# Patient Record
Sex: Female | Born: 1970
Health system: Southern US, Community
[De-identification: ages and names within clinical notes are randomized; demographics above are authoritative.]

## PROBLEM LIST (undated history)

## (undated) DIAGNOSIS — F329 Major depressive disorder, single episode, unspecified: Secondary | ICD-10-CM

## (undated) DIAGNOSIS — I10 Essential (primary) hypertension: Secondary | ICD-10-CM

## (undated) DIAGNOSIS — F32A Depression, unspecified: Secondary | ICD-10-CM

## (undated) HISTORY — DX: Major depressive disorder, single episode, unspecified: F32.9

## (undated) HISTORY — DX: Depression, unspecified: F32.A

---

## 2011-03-07 ENCOUNTER — Emergency Department (HOSPITAL_COMMUNITY)
Admission: EM | Admit: 2011-03-07 | Discharge: 2011-03-07 | Discharge status: Against Medical Advice | Payer: Self-pay | Attending: Emergency Medicine | Admitting: Emergency Medicine

## 2011-03-07 ENCOUNTER — Other Ambulatory Visit: Payer: Self-pay

## 2011-03-07 ENCOUNTER — Encounter: Payer: Self-pay | Admitting: Emergency Medicine

## 2011-03-07 ENCOUNTER — Emergency Department (INDEPENDENT_AMBULATORY_CARE_PROVIDER_SITE_OTHER): Payer: Self-pay

## 2011-03-07 ENCOUNTER — Emergency Department (HOSPITAL_BASED_OUTPATIENT_CLINIC_OR_DEPARTMENT_OTHER)
Admission: EM | Admit: 2011-03-07 | Discharge: 2011-03-07 | Disposition: A | Discharge status: Home / Self Care | Payer: Self-pay | Attending: Emergency Medicine | Admitting: Emergency Medicine

## 2011-03-07 ENCOUNTER — Encounter (HOSPITAL_BASED_OUTPATIENT_CLINIC_OR_DEPARTMENT_OTHER): Payer: Self-pay

## 2011-03-07 DIAGNOSIS — R42 Dizziness and giddiness: Secondary | ICD-10-CM | POA: Insufficient documentation

## 2011-03-07 DIAGNOSIS — F419 Anxiety disorder, unspecified: Secondary | ICD-10-CM

## 2011-03-07 DIAGNOSIS — R079 Chest pain, unspecified: Secondary | ICD-10-CM

## 2011-03-07 DIAGNOSIS — R209 Unspecified disturbances of skin sensation: Secondary | ICD-10-CM

## 2011-03-07 DIAGNOSIS — F411 Generalized anxiety disorder: Secondary | ICD-10-CM | POA: Insufficient documentation

## 2011-03-07 DIAGNOSIS — M79609 Pain in unspecified limb: Secondary | ICD-10-CM | POA: Insufficient documentation

## 2011-03-07 DIAGNOSIS — M542 Cervicalgia: Secondary | ICD-10-CM | POA: Insufficient documentation

## 2011-03-07 LAB — CBC
Hemoglobin: 12.2 g/dL (ref 12.0–15.0)
MCH: 22.3 pg — ABNORMAL LOW (ref 26.0–34.0)
RBC: 5.48 MIL/uL — ABNORMAL HIGH (ref 3.87–5.11)
WBC: 4.8 10*3/uL (ref 4.0–10.5)

## 2011-03-07 LAB — CARDIAC PANEL(CRET KIN+CKTOT+MB+TROPI)
CK, MB: 1.8 ng/mL (ref 0.3–4.0)
Relative Index: 0.5 (ref 0.0–2.5)
Troponin I: <0.3 ng/mL (ref <0.30)

## 2011-03-07 LAB — BASIC METABOLIC PANEL
CO2: 25 mEq/L (ref 19–32)
Chloride: 103 mEq/L (ref 96–112)
Glucose, Bld: 108 mg/dL — ABNORMAL HIGH (ref 70–99)
Potassium: 4.9 mEq/L (ref 3.5–5.1)
Sodium: 136 mEq/L (ref 135–145)

## 2011-03-07 MED ORDER — MORPHINE SULFATE 4 MG/ML IJ SOLN
4.0000 mg | Freq: Once | INTRAMUSCULAR | Status: DC
  Filled 2011-03-07: qty 1

## 2011-03-07 MED ORDER — ALPRAZOLAM 1 MG PO TABS: 1.0000 mg | ORAL_TABLET | Freq: Three times a day (TID) | ORAL | Status: AC | PRN

## 2011-03-07 MED ORDER — ONDANSETRON HCL 4 MG/2ML IJ SOLN
4.0000 mg | Freq: Once | INTRAMUSCULAR | Status: DC
Start: 2011-03-07 — End: 2011-03-07
  Filled 2011-03-07: qty 2

## 2011-03-07 NOTE — ED Provider Notes (Signed)
History/physical exam/procedure(s) were performed by non-physician practitioner and as supervising physician I was immediately available for consultation/collaboration. I have reviewed all notes and am in agreement with care and plan.   Hilario Quarry, MD 03/07/11 2229

## 2011-03-07 NOTE — ED Provider Notes (Signed)
History     CSN: [id number] Arrival date & time: 03/07/2011  4:55 PM   First MD Initiated Contact with Patient 03/07/11 1721      Chief Complaint  Patient presents with  . Chest Pain  . Dizziness  . Numbness    (Consider location/radiation/quality/duration/timing/severity/associated sxs/prior treatment) HPI Comments: Pt states that she was at work learning a new job when she had some tingling in her left arm and radiated down from her neck:pt states that she felt near syncopal dizzy so she went to the hospital and waited and the symptoms seems to worsen so she decided to drive over here and when she was driving over here she developed cp  Patient is a 40 y.o. female presenting with chest pain. The history is provided by the patient. No language interpreter was used.  Chest Pain The chest pain began 3 - 5 hours ago. Chest pain occurs intermittently. The chest pain is improving. The pain is associated with stress. The severity of the pain is moderate. The quality of the pain is described as aching. The pain does not radiate. Pertinent negatives for primary symptoms include no fever, no cough, no wheezing, no nausea, no vomiting and no dizziness.  Pertinent negatives for associated symptoms include no numbness. She tried nothing for the symptoms. Risk factors include stress.  Her past medical history is significant for hypertension.     History reviewed. No pertinent past medical history.  History reviewed. No pertinent past surgical history.  No family history on file.  History  Substance Use Topics  . Smoking status: Never Smoker   . Smokeless tobacco: Never Used  . Alcohol Use: Yes     Only ocassionally    OB History    Grav Para Term Preterm Abortions TAB SAB Ect Mult Living                  Review of Systems  Constitutional: Negative for fever.  Respiratory: Negative for cough and wheezing.   Cardiovascular: Positive for chest pain.  Gastrointestinal: Negative for  nausea and vomiting.  Neurological: Negative for dizziness and numbness.  All other systems reviewed and are negative.    Allergies  Review of patient's allergies indicates no known allergies.  Home Medications   Current Outpatient Rx  Name Route Sig Dispense Refill  . DIPHENHYDRAMINE HCL 25 MG PO TABS Oral Take 50 mg by mouth at bedtime. For allergy     . LYSINE 500 MG PO CAPS Oral Take 2 capsules by mouth daily.      Carma Leaven M PLUS PO TABS Oral Take 1 tablet by mouth every evening.      Marland Kitchen NAPHAZOLINE HCL 0.012 % OP SOLN Both Eyes Place 2 drops into both eyes 2 (two) times daily as needed. For red dry eyes     . LYSINE PO Oral Take 1 capsule by mouth daily.        BP 173/107  Pulse 100  Temp(Src) 98.3 F (36.8 C) (Oral)  Resp 18  Ht 5\' 4"  (1.626 m)  Wt 175 lb (79.379 kg)  BMI 30.04 kg/m2  SpO2 100%  LMP 02/28/2011  Physical Exam  Nursing note and vitals reviewed. Constitutional: She is oriented to person, place, and time. She appears well-developed and well-nourished.  HENT:  Head: Normocephalic and atraumatic.  Eyes: Conjunctivae and EOM are normal. Pupils are equal, round, and reactive to light.  Neck: Normal range of motion.  Cardiovascular: Normal rate and regular rhythm.  Pulmonary/Chest: Effort normal and breath sounds normal. She exhibits tenderness.  Abdominal: Soft. Bowel sounds are normal.  Musculoskeletal: Normal range of motion.  Neurological: She is alert and oriented to person, place, and time.  Skin: Skin is warm and dry.  Psychiatric: Her mood appears anxious.    ED Course  Procedures (including critical care time)  Labs Reviewed  CBC - Abnormal; Notable for the following:    RBC 5.48 (*)    MCV 66.8 (*)    MCH 22.3 (*)    RDW 17.0 (*)    All other components within normal limits  BASIC METABOLIC PANEL - Abnormal; Notable for the following:    Glucose, Bld 108 (*)    All other components within normal limits  CARDIAC PANEL(CRET  KIN+CKTOT+MB+TROPI) - Abnormal; Notable for the following:    Total CK 352 (*)    All other components within normal limits   Dg Chest 2 View  03/07/2011  *RADIOLOGY REPORT*  Clinical Data: Dizziness.  Left arm numbness  CHEST - 2 VIEW  Comparison: None.  Findings: Mild convex right thoracolumbar spine curvature. Midline trachea.  Normal heart size and mediastinal contours. No pleural effusion or pneumothorax.  Clear lungs.  IMPRESSION: No acute cardiopulmonary disease.  Original Report Authenticated By: Consuello Bossier, M.D.    Date: 03/07/2011  Rate: 83  Rhythm: normal sinus rhythm  QRS Axis: normal  Intervals: normal  ST/T Wave abnormalities: normal  Conduction Disutrbances:none  Narrative Interpretation:   Old EKG Reviewed: none available   1. Chest pain   2. 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

## 2011-03-07 NOTE — ED Notes (Signed)
Pt reports she was driving when she developed chest pain.  She also reports left arm numbness and tingling radiating to shoulder and neck.

## 2011-03-07 NOTE — ED Notes (Signed)
Pt st's she was at work and became dizzy after eating lunch, then developed pain in back of neck and left arm.  Pt denies any chest pain at this time

## 2011-08-04 ENCOUNTER — Other Ambulatory Visit (HOSPITAL_COMMUNITY)
Admission: RE | Admit: 2011-08-04 | Discharge: 2011-08-04 | Disposition: A | Discharge status: Home / Self Care | Payer: 59 | Source: Ambulatory Visit | Attending: Obstetrics & Gynecology | Admitting: Obstetrics & Gynecology

## 2011-08-04 DIAGNOSIS — Z1159 Encounter for screening for other viral diseases: Secondary | ICD-10-CM | POA: Insufficient documentation

## 2011-08-04 DIAGNOSIS — Z113 Encounter for screening for infections with a predominantly sexual mode of transmission: Secondary | ICD-10-CM | POA: Insufficient documentation

## 2011-08-04 DIAGNOSIS — Z124 Encounter for screening for malignant neoplasm of cervix: Secondary | ICD-10-CM | POA: Insufficient documentation

## 2011-08-22 ENCOUNTER — Encounter: Payer: Self-pay | Admitting: Gastroenterology

## 2011-09-12 ENCOUNTER — Ambulatory Visit: Payer: 59 | Admitting: Gastroenterology

## 2011-10-09 ENCOUNTER — Encounter: Payer: Self-pay | Admitting: Gastroenterology

## 2011-10-09 ENCOUNTER — Ambulatory Visit (INDEPENDENT_AMBULATORY_CARE_PROVIDER_SITE_OTHER): Payer: 59 | Admitting: Gastroenterology

## 2011-10-09 ENCOUNTER — Ambulatory Visit: Payer: 59 | Admitting: Gastroenterology

## 2011-10-09 VITALS — BP 134/78 | HR 88 | Ht 64.0 in | Wt 184.0 lb

## 2011-10-09 DIAGNOSIS — R195 Other fecal abnormalities: Secondary | ICD-10-CM

## 2011-10-09 MED ORDER — MOVIPREP 100 G PO SOLR: 1.0000 | Freq: Once | ORAL | Status: DC

## 2011-10-09 NOTE — Patient Instructions (Addendum)
You have been scheduled for a colonoscopy with propofol. Please follow written instructions given to you at your visit today.  Please pick up your prep kit at the pharmacy within the next 1-3 days. cc: Filbert Berthold, MD

## 2011-10-09 NOTE — Progress Notes (Signed)
History of Present Illness: This is a 41 year old female was recently found to have iFOB positive stool on screening examination. She has no gastrointestinal complaints and there is no family history of colon cancer or colon polyps. Blood work revealed microcytosis without anemia. Denies weight loss, abdominal pain, constipation, diarrhea, change in stool caliber, melena, hematochezia, nausea, vomiting, dysphagia, reflux symptoms, chest pain.  No Known Allergies Outpatient Prescriptions Prior to Visit  Medication Sig Dispense Refill  . diphenhydrAMINE (BENADRYL) 25 MG tablet Take 50 mg by mouth at bedtime. For allergy       . Multiple Vitamins-Minerals (MULTIVITAMINS THER. W/MINERALS) TABS Take 1 tablet by mouth every evening.        . naphazoline (CLEAR EYES) 0.012 % ophthalmic solution Place 2 drops into both eyes 2 (two) times daily as needed. For red dry eyes       . Lysine 500 MG CAPS Take 2 capsules by mouth daily.       Marland Kitchen LYSINE PO Take 1 capsule by mouth daily.         Past Medical History  Diagnosis Date  . Depression    No past surgical history on file. History   Social History  . Marital Status: Divorced    Spouse Name: N/A    Number of Children: N/A  . Years of Education: N/A   Occupational History  . Customer Care Professional  [employer]   Social History Main Topics  . Smoking status: Never Smoker   . Smokeless tobacco: Never Used  . Alcohol Use: 1.2 oz/week    2 Glasses of wine per week  . Drug Use: No  . Sexually Active: Not Currently   Other Topics Concern  . None   Social History Narrative   Daily caffeine    Family History  Problem Relation Age of Onset  . Ovarian cancer Mother   . Diabetes Maternal Grandmother   . Hypertension      Grandparents  . Colon cancer Neg Hx     Review of Systems: Pertinent positive and negative review of systems were noted in the above HPI section. All other review of systems were otherwise negative.  Physical  Exam: General: Well developed , well nourished, no acute distress Head: Normocephalic and atraumatic Eyes:  sclerae anicteric, EOMI Ears: Normal auditory acuity Mouth: No deformity or lesions Neck: Supple, no masses or thyromegaly Lungs: Clear throughout to auscultation Heart: Regular rate and rhythm; no murmurs, rubs or bruits Abdomen: Soft, non tender and non distended. No masses, hepatosplenomegaly or hernias noted. Normal Bowel sounds Rectal: Deferred to colonoscopy Musculoskeletal: Symmetrical with no gross deformities  Skin: No lesions on visible extremities Pulses:  Normal pulses noted Extremities: No clubbing, cyanosis, edema or deformities noted Neurological: Alert oriented x 4, grossly nonfocal Cervical Nodes:  No significant cervical adenopathy Inguinal Nodes: No significant inguinal adenopathy Psychological:  Alert and cooperative. Normal mood and affect  Assessment and Recommendations:  1. Heme positive stool. Rule out colorectal neoplasms. Schedule colonoscopy. The risks, benefits, and alternatives to colonoscopy with possible biopsy and possible polypectomy were discussed with the patient and they consent to proceed.

## 2011-11-01 ENCOUNTER — Telehealth: Payer: Self-pay | Admitting: Gastroenterology

## 2011-11-01 NOTE — Telephone Encounter (Signed)
I have mailed her a rebate coupon for Moviprep

## 2011-11-01 NOTE — Telephone Encounter (Signed)
Patient is scheduled for a colonoscopy next week with Dr. Russella Dar (11/09/11).  She says that the prep is "like" 70.00 and wants to know if there is anything cheaper.  Please advise.

## 2011-11-09 ENCOUNTER — Encounter: Payer: 59 | Admitting: Gastroenterology

## 2014-09-16 ENCOUNTER — Other Ambulatory Visit: Payer: Self-pay | Admitting: Obstetrics & Gynecology

## 2014-09-16 ENCOUNTER — Other Ambulatory Visit (HOSPITAL_COMMUNITY)
Admission: RE | Admit: 2014-09-16 | Discharge: 2014-09-16 | Disposition: A | Discharge status: Home / Self Care | Payer: BLUE CROSS/BLUE SHIELD | Source: Ambulatory Visit | Attending: Obstetrics & Gynecology | Admitting: Obstetrics & Gynecology

## 2014-09-16 DIAGNOSIS — Z1151 Encounter for screening for human papillomavirus (HPV): Secondary | ICD-10-CM | POA: Insufficient documentation

## 2014-09-16 DIAGNOSIS — Z01419 Encounter for gynecological examination (general) (routine) without abnormal findings: Secondary | ICD-10-CM | POA: Diagnosis not present

## 2014-09-22 LAB — CYTOLOGY - PAP

## 2015-02-02 ENCOUNTER — Emergency Department (HOSPITAL_BASED_OUTPATIENT_CLINIC_OR_DEPARTMENT_OTHER): Payer: BLUE CROSS/BLUE SHIELD

## 2015-02-02 ENCOUNTER — Emergency Department (HOSPITAL_BASED_OUTPATIENT_CLINIC_OR_DEPARTMENT_OTHER)
Admission: EM | Admit: 2015-02-02 | Discharge: 2015-02-02 | Disposition: A | Discharge status: Home / Self Care | Payer: BLUE CROSS/BLUE SHIELD | Attending: Emergency Medicine | Admitting: Emergency Medicine

## 2015-02-02 ENCOUNTER — Encounter (HOSPITAL_BASED_OUTPATIENT_CLINIC_OR_DEPARTMENT_OTHER): Payer: Self-pay | Admitting: *Deleted

## 2015-02-02 DIAGNOSIS — R1032 Left lower quadrant pain: Secondary | ICD-10-CM | POA: Diagnosis not present

## 2015-02-02 DIAGNOSIS — R1030 Lower abdominal pain, unspecified: Secondary | ICD-10-CM | POA: Diagnosis present

## 2015-02-02 DIAGNOSIS — R102 Pelvic and perineal pain: Secondary | ICD-10-CM | POA: Insufficient documentation

## 2015-02-02 DIAGNOSIS — R42 Dizziness and giddiness: Secondary | ICD-10-CM | POA: Insufficient documentation

## 2015-02-02 DIAGNOSIS — R300 Dysuria: Secondary | ICD-10-CM | POA: Diagnosis not present

## 2015-02-02 DIAGNOSIS — I10 Essential (primary) hypertension: Secondary | ICD-10-CM | POA: Insufficient documentation

## 2015-02-02 DIAGNOSIS — R109 Unspecified abdominal pain: Secondary | ICD-10-CM

## 2015-02-02 DIAGNOSIS — Z3202 Encounter for pregnancy test, result negative: Secondary | ICD-10-CM | POA: Insufficient documentation

## 2015-02-02 DIAGNOSIS — Z8659 Personal history of other mental and behavioral disorders: Secondary | ICD-10-CM | POA: Diagnosis not present

## 2015-02-02 DIAGNOSIS — R319 Hematuria, unspecified: Secondary | ICD-10-CM | POA: Insufficient documentation

## 2015-02-02 HISTORY — DX: Essential (primary) hypertension: I10

## 2015-02-02 LAB — CBC WITH DIFFERENTIAL/PLATELET
Basophils Absolute: 0 10*3/uL (ref 0.0–0.1)
Basophils Relative: 0 %
EOS ABS: 0.2 10*3/uL (ref 0.0–0.7)
Eosinophils Relative: 3 %
HEMATOCRIT: 34.8 % — AB (ref 36.0–46.0)
HEMOGLOBIN: 11.3 g/dL — AB (ref 12.0–15.0)
LYMPHS PCT: 39 %
Lymphs Abs: 2.3 10*3/uL (ref 0.7–4.0)
MCH: 21.9 pg — ABNORMAL LOW (ref 26.0–34.0)
MCHC: 32.5 g/dL (ref 30.0–36.0)
MCV: 67.3 fL — ABNORMAL LOW (ref 78.0–100.0)
MONO ABS: 0.5 10*3/uL (ref 0.1–1.0)
Monocytes Relative: 8 %
NEUTROS PCT: 50 %
Neutro Abs: 2.8 10*3/uL (ref 1.7–7.7)
Platelets: 257 10*3/uL (ref 150–400)
RBC: 5.17 MIL/uL — AB (ref 3.87–5.11)
RDW: 15.7 % — ABNORMAL HIGH (ref 11.5–15.5)
WBC: 5.8 10*3/uL (ref 4.0–10.5)

## 2015-02-02 LAB — URINALYSIS, ROUTINE W REFLEX MICROSCOPIC
BILIRUBIN URINE: NEGATIVE
Glucose, UA: NEGATIVE mg/dL
KETONES UR: NEGATIVE mg/dL
NITRITE: NEGATIVE
PH: 6 (ref 5.0–8.0)
Protein, ur: NEGATIVE mg/dL
SPECIFIC GRAVITY, URINE: 1.016 (ref 1.005–1.030)
UROBILINOGEN UA: 0.2 mg/dL (ref 0.0–1.0)

## 2015-02-02 LAB — PREGNANCY, URINE: Preg Test, Ur: NEGATIVE

## 2015-02-02 LAB — COMPREHENSIVE METABOLIC PANEL
ALBUMIN: 3.9 g/dL (ref 3.5–5.0)
ALK PHOS: 59 U/L (ref 38–126)
ALT: 17 U/L (ref 14–54)
ANION GAP: 3 — AB (ref 5–15)
AST: 16 U/L (ref 15–41)
BILIRUBIN TOTAL: 0.4 mg/dL (ref 0.3–1.2)
BUN: 10 mg/dL (ref 6–20)
CALCIUM: 8.8 mg/dL — AB (ref 8.9–10.3)
CO2: 24 mmol/L (ref 22–32)
CREATININE: 0.59 mg/dL (ref 0.44–1.00)
Chloride: 108 mmol/L (ref 101–111)
GFR calc Af Amer: >60 mL/min (ref >60)
GFR calc non Af Amer: >60 mL/min (ref >60)
GLUCOSE: 111 mg/dL — AB (ref 65–99)
Potassium: 3.9 mmol/L (ref 3.5–5.1)
SODIUM: 135 mmol/L (ref 135–145)
Total Protein: 7.2 g/dL (ref 6.5–8.1)

## 2015-02-02 LAB — URINE MICROSCOPIC-ADD ON

## 2015-02-02 MED ORDER — NITROFURANTOIN MONOHYD MACRO 100 MG PO CAPS: 100.0000 mg | ORAL_CAPSULE | Freq: Two times a day (BID) | ORAL | Status: DC

## 2015-02-02 MED ORDER — PHENAZOPYRIDINE HCL 200 MG PO TABS: 200.0000 mg | ORAL_TABLET | Freq: Three times a day (TID) | ORAL | Status: DC

## 2015-02-02 NOTE — ED Notes (Addendum)
Lower abdominal pain started at work today. Hematuria.

## 2015-02-02 NOTE — ED Provider Notes (Signed)
CSN: [id number]     Arrival date & time 02/02/15  2031 History  By signing my name below, I, Meriel Pica, attest that this documentation has been prepared under the direction and in the presence of Shawn Joy, PA-C. Electronically Signed: Meriel Pica, ED Scribe. 02/02/2015. 9:01 PM.   Chief Complaint  Patient presents with  . Abdominal Pain   The history is provided by the patient. No language interpreter was used.   HPI Comments: Casey Palmer is a 44 y.o. female, with a PMhx of HTN and depression, who presents to the Emergency Department complaining of sudden onset, intermittent, sharp, left-sided suprapubic abdominal pain onset while at work 9 hours ago. She associates intermittent, 10/10 dysuria that she describes both as a pressure and as a burning sensation. Pt also states while at work she urinated and visualized blood with wiping. She additionally reports feeling light-headed throughout the day today. Denies abdominal pain currently. Denies lightheadedness/dizziness currently. LNMP was approximately 1 week ago. Denies abnormal vaginal discharge, vaginal pain, fevers or chills, nausea, vomiting or diarrhea, or BLE edema. Also denies pain with BMs, melena or hematochezia.   Past Medical History  Diagnosis Date  . Depression   . Hypertension    History reviewed. No pertinent past surgical history. Family History  Problem Relation Age of Onset  . Ovarian cancer Mother   . Diabetes Maternal Grandmother   . Hypertension      Grandparents  . Colon cancer Neg Hx    Social History  Substance Use Topics  . Smoking status: Never Smoker   . Smokeless tobacco: Never Used  . Alcohol Use: 1.2 oz/week    2 Glasses of wine per week   OB History    No data available     Review of Systems  Constitutional: Negative for fever, chills, diaphoresis and appetite change.  Respiratory: Negative for chest tightness and shortness of breath.   Cardiovascular: Negative for chest pain and  leg swelling.  Gastrointestinal: Positive for abdominal pain. Negative for nausea, vomiting, diarrhea, constipation and abdominal distention.  Genitourinary: Positive for dysuria, hematuria and pelvic pain. Negative for decreased urine volume, vaginal bleeding, vaginal discharge, difficulty urinating and vaginal pain.  Musculoskeletal: Negative for myalgias, back pain and arthralgias.  Neurological: Positive for light-headedness.  All other systems reviewed and are negative.  Allergies  Review of patient's allergies indicates no known allergies.  Home Medications   Prior to Admission medications   Medication Sig Start Date End Date Taking? Authorizing Provider  LOSARTAN POTASSIUM PO Take by mouth.   Yes Historical Provider, MD  diphenhydrAMINE (BENADRYL) 25 MG tablet Take 50 mg by mouth at bedtime. For allergy     Historical Provider, MD  Lysine 1000 MG TABS Take 1 tablet by mouth daily.    Historical Provider, MD  MOVIPREP 100 G SOLR Take 1 kit (100 g total) by mouth once. 10/09/11   Ladene Artist, MD  Multiple Vitamins-Minerals (MULTIVITAMINS THER. W/MINERALS) TABS Take 1 tablet by mouth every evening.      Historical Provider, MD  naphazoline (CLEAR EYES) 0.012 % ophthalmic solution Place 2 drops into both eyes 2 (two) times daily as needed. For red dry eyes     Historical Provider, MD  nitrofurantoin, macrocrystal-monohydrate, (MACROBID) 100 MG capsule Take 1 capsule (100 mg total) by mouth 2 (two) times daily. 02/02/15   Shawn C Joy, PA-C  phenazopyridine (PYRIDIUM) 200 MG tablet Take 1 tablet (200 mg total) by mouth 3 (three) times daily. 02/02/15  Shawn C Joy, PA-C   BP 159/104 mmHg  Pulse 88  Temp(Src) 98 F (36.7 C) (Oral)  Resp 20  Ht 5' 3.5" (1.613 m)  Wt 180 lb (81.647 kg)  BMI 31.38 kg/m2  SpO2 100%  LMP 01/26/2015 Physical Exam  Constitutional: She appears well-developed and well-nourished. No distress.  HENT:  Head: Normocephalic and atraumatic.  Eyes:  Conjunctivae are normal. Pupils are equal, round, and reactive to light.  Neck: Normal range of motion. Neck supple.  Cardiovascular: Normal rate, regular rhythm and normal heart sounds.   Pulmonary/Chest: Effort normal and breath sounds normal. No respiratory distress. She has no wheezes.  Abdominal: Soft. Normal appearance and bowel sounds are normal. She exhibits no distension and no mass. There is no tenderness. There is no CVA tenderness.  Lymphadenopathy:    She has no cervical adenopathy.  Neurological: She is alert.  Skin: Skin is warm and dry. No pallor.  Psychiatric: She has a normal mood and affect. Her behavior is normal.  Nursing note and vitals reviewed.  ED Course  Procedures  DIAGNOSTIC STUDIES: Oxygen Saturation is 100% on RA, normal by my interpretation.    COORDINATION OF CARE: 8:57 PM Discussed treatment plan with pt at bedside and pt agreed to plan.    Labs Review Labs Reviewed  URINALYSIS, ROUTINE W REFLEX MICROSCOPIC (NOT AT [facility]) - Abnormal; Notable for the following:    Hgb urine dipstick LARGE (*)    Leukocytes, UA SMALL (*)    All other components within normal limits  CBC WITH DIFFERENTIAL/PLATELET - Abnormal; Notable for the following:    RBC 5.17 (*)    Hemoglobin 11.3 (*)    HCT 34.8 (*)    MCV 67.3 (*)    MCH 21.9 (*)    RDW 15.7 (*)    All other components within normal limits  COMPREHENSIVE METABOLIC PANEL - Abnormal; Notable for the following:    Glucose, Bld 111 (*)    Calcium 8.8 (*)    Anion gap 3 (*)    All other components within normal limits  URINE MICROSCOPIC-ADD ON - Abnormal; Notable for the following:    Squamous Epithelial / LPF FEW (*)    All other components within normal limits  PREGNANCY, URINE    Imaging Review Ct Renal Stone Study  02/02/2015   CLINICAL DATA:  Left lower quadrant pain with pain and pressure across the lower abdomen since this morning. Difficulty urinating. Hematuria.  EXAM: CT ABDOMEN AND PELVIS  WITHOUT CONTRAST  TECHNIQUE: Multidetector CT imaging of the abdomen and pelvis was performed following the standard protocol without IV contrast.  COMPARISON:  None.  FINDINGS: Lung bases are clear.  Kidneys appear symmetrical in size and shape. No hydronephrosis or hydroureter. No renal, ureteral, or bladder stones. No bladder wall thickening.  The unenhanced appearance of the liver, spleen, gallbladder, pancreas, adrenal glands, abdominal aorta, inferior vena cava, and retroperitoneal lymph nodes is unremarkable. Stomach and small bowel are mostly decompressed. Stool-filled colon without abnormal distention. No free air or free fluid in the abdomen. Abdominal wall musculature appears intact.  Pelvis: Appendix is normal. Lobular contour of the posterior uterine fundus consistent with uterine fibroid. Ovaries are not enlarged. No pelvic mass or lymphadenopathy. No free or loculated pelvic fluid collections. Stool-filled decompressed rectosigmoid colon with scattered diverticula. No evidence of diverticulitis. No destructive bone lesions.  IMPRESSION: No renal or ureteral stone or obstruction. No acute process demonstrated in the abdomen or pelvis on noncontrast imaging. Probable  uterine fibroid.   Electronically Signed   By: Lucienne Capers M.D.   On: 02/02/2015 21:46   I have personally reviewed and evaluated these images and lab results as part of my medical decision-making.   MDM   Final diagnoses:  Abdominal pain in female  LLQ pain    Kimetha Chrismer presents with sudden onset lower left abdominal pain with dysuria.  Findings and plan of care discussed with Veryl Speak, MD.  Pt presentation and incident history gives suspicion for possible renal stone vs UTI. Pain is intermittent, goes to zero in between episodes, and is accompanied by dysuria. Doubt torsion.  UA does not give definitive evidence of UTI. CBC shows Hgb of 11.3, consistent with previous CBC. No increased WBC count.   CT  free from abnormalities. No renal stone or evidence of structural abnormalities. Pt possibly had a renal stone that she passed. Pt to be discharged and instructed to return should the problem arise again.  Pt to be discharged with prescription for Macrobid.  I personally performed the services described in this documentation, which was scribed in my presence. The recorded information has been reviewed and is accurate.   Lorayne Bender, PA-C 02/02/15 2213  Veryl Speak, MD 02/02/15 2251

## 2015-02-02 NOTE — Discharge Instructions (Signed)
You have been seen today with lower abdominal pain that has since resolved. Return to ED should problem recur. Followup with PCP as needed. A resource guide is included to help you select a PCP.    Abdominal Pain, Adult Many things can cause abdominal pain. Usually, abdominal pain is not caused by a disease and will improve without treatment. It can often be observed and treated at home. Your health care provider will do a physical exam and possibly order blood tests and X-rays to help determine the seriousness of your pain. However, in many cases, more time must pass before a clear cause of the pain can be found. Before that point, your health care provider may not know if you need more testing or further treatment. HOME CARE INSTRUCTIONS Monitor your abdominal pain for any changes. The following actions may help to alleviate any discomfort you are experiencing:  Only take over-the-counter or prescription medicines as directed by your health care provider.  Do not take laxatives unless directed to do so by your health care provider.  Try a clear liquid diet (broth, tea, or water) as directed by your health care provider. Slowly move to a bland diet as tolerated. SEEK MEDICAL CARE IF:  You have unexplained abdominal pain.  You have abdominal pain associated with nausea or diarrhea.  You have pain when you urinate or have a bowel movement.  You experience abdominal pain that wakes you in the night.  You have abdominal pain that is worsened or improved by eating food.  You have abdominal pain that is worsened with eating fatty foods.  You have a fever. SEEK IMMEDIATE MEDICAL CARE IF:  Your pain does not go away within 2 hours.  You keep throwing up (vomiting).  Your pain is felt only in portions of the abdomen, such as the right side or the left lower portion of the abdomen.  You pass bloody or black tarry stools. MAKE SURE YOU:  Understand these instructions.  Will watch your  condition.  Will get help right away if you are not doing well or get worse.   This information is not intended to replace advice given to you by your health care provider. Make sure you discuss any questions you have with your health care provider.   Document Released: 01/18/2005 Document Revised: 12/30/2014 Document Reviewed: 12/18/2012 Elsevier Interactive Patient Education 2016 [employer].   Emergency Department Resource Guide 1) Find a Doctor and Pay Out of Pocket Although you won't have to find out who is covered by your insurance plan, it is a good idea to ask around and get recommendations. You will then need to call the office and see if the doctor you have chosen will accept you as a new patient and what types of options they offer for patients who are self-pay. Some doctors offer discounts or will set up payment plans for their patients who do not have insurance, but you will need to ask so you aren't surprised when you get to your appointment.  2) Contact Your Local Health Department Not all health departments have doctors that can see patients for sick visits, but many do, so it is worth a call to see if yours does. If you don't know where your local health department is, you can check in your phone book. The CDC also has a tool to help you locate your state's health department, and many state websites also have listings of all of their local health departments.  3) Find  a Walk-in Clinic If your illness is not likely to be very severe or complicated, you may want to try a walk in clinic. These are popping up all over the country in pharmacies, drugstores, and shopping centers. They're usually staffed by nurse practitioners or physician assistants that have been trained to treat common illnesses and complaints. They're usually fairly quick and inexpensive. However, if you have serious medical issues or chronic medical problems, these are probably not your best option.  No Primary  Care Doctor: - Call Health Connect at  [phone] - they can help you locate a primary care doctor that  accepts your insurance, provides certain services, etc. - Physician Referral Service- [phone]  Chronic Pain Problems: Organization         Address  Phone   Notes  [facility]  [phone] Patients need to be referred by their primary care doctor.   Medication Assistance: Organization         Address  Phone   Notes  [facility] Medication [facility] [address] [place], [place] [address] [phone] --Must be a resident of [facility] -- Must have NO insurance coverage whatsoever (no Medicaid/ Medicare, etc.) -- The pt. MUST have a primary care doctor that directs their care regularly and follows them in the community   [facility]  [phone]   [employer]  [phone]    Agencies that provide inexpensive medical care: Organization         Address  Phone   Notes  [facility]  [phone]   [facility]    [phone]   [facility] [address] [place], [place] [address] [phone]   [facility] [address], [place] [phone]   Planned Parenthood    [phone]   [facility]    [phone]   [facility]  [address], [place] Phone:  [phone], Fax:  [phone] Hours of Operation:  9 am - 6 pm, M-F.  Also accepts Medicaid/Medicare and self-pay.  [facility]  [address], [place] Phone: [phone], Fax: [phone]. Hours of Operation:  8:30 am - 5:30 pm, M-F.  Also accepts Medicaid and self-pay.  [facility] [place] [address] [place] Phone: [phone]   [facility] [address] Natasha Bence [place], [place] [phone], Ext. [phone] Mondays & Thursdays: 7-9 AM.  First 15 patients are seen on a first  come, first serve basis.    Medicaid-accepting [facility] Providers:  Organization         Address  Phone   Notes  [facility] [address] [place] [address], [place] [phone] Also accepts self-pay patients.  [facility] [address] Laurell Josephs [place], [place]  [phone]   [facility] [address], [place] [phone]   [facility] [address] [place] [phone]   Renaye Rakers [address], [place]   [phone] Only accepts [place] Access [place] patients after they have their name applied to their card.   Self-Pay (no insurance) in [facility]:  Organization         Address  Phone   Notes  Sickle Cell Patients, [facility] [address]  [place], [place] [phone]   [facility] [address] [place], [place] [phone]   [facility] [place]  [address], [place] [phone]   [facility]/Dr. Osei-Bonsu  [address] [place] or [address], [place] [phone] Phone number for both [place] and [place] locations is the same.  [facility] [address], [place] [phone]   [facility] [address], [place] or [address] [phone]   [facility] [address] [place] [phone], phone; [phone], fax Sees patients 1st and 3rd Saturday of every month.  Must not qualify for public or private insurance (i.e. Medicaid, Medicare, [place] Health Choice, Veterans' Benefits)  Household income should be no more than 200% of the poverty level The clinic cannot treat you if you are pregnant or think you are pregnant  Sexually transmitted diseases are not treated at the clinic.    Dental Care: Organization          Address  Phone  Notes  [facility] [address] [place], [place] [phone] Accepts children up to age 6 who are enrolled in [place] or [place] Health Choice; pregnant women with a Medicaid card; and children who have applied for Medicaid or [place] Health Choice, but were declined, whose parents can pay a reduced fee at time of service.  [facility]  [address], [place] [phone] Accepts children up to age 77 who are enrolled in [place] or [place] Health Choice; pregnant women with a Medicaid card; and children who have applied for Medicaid or [place] Health Choice, but were declined, whose parents can pay a reduced fee at time of service.  [facility]  [address] [place], [place] [phone] Patients are seen by appointment only. Walk-ins are not accepted. [facility] will see patients 30 years of age and older. Monday - Tuesday (8am-5pm) Most Wednesdays (8:30-5pm) $30 per visit, cash only  [facility] Adult Dental Access PRO[facility]  [address], [facility] [phone] Patients are seen by appointment only. Walk-ins are not accepted. [facility] will see patients 4 years of age and older. One Wednesday Evening (Monthly: Volunteer Based).  $30 per visit, cash only  [employer]  [phone] for adults; Children under age 71, call Graduate Pediatric Dentistry at [phone]. Children aged 61-14, please call [phone] to request a pediatric application.  Dental services are provided in all areas of dental care including fillings, crowns and bridges, complete and partial dentures, implants, gum treatment, root canals, and extractions. Preventive care is also provided. Treatment is provided to both adults and children. Patients are selected via a lottery and there is often a waiting list.   [facility] [address], [place]  [phone] [email or web address]   [facility] [address] [place], [place] [phone], Ext. [phone] Second and Fourth Thursday of each month, opens at 6:30 AM; Clinic ends at 9 AM.  Patients are seen on a first-come first-served basis, and a limited number are seen during each clinic.   [facility]  [address] Ether Griffins [place], [place] [phone]   Eligibility Requirements You must have lived in [place], [place], or  [place] counties for at least the last three months.   You cannot be eligible for state or federal sponsored [place], including [employer], [place], or [employer].   You generally cannot be eligible for healthcare insurance through your employer.    How to apply: Eligibility screenings are held every Tuesday and Wednesday afternoon from 1:00 pm until 4:00 pm. You do not need an appointment for the interview!  [facility] [address] [place], [place] [phone]   [facility] Health Department  [phone]   [facility] Health Department  [phone]   [facility] Health Department  [phone]    Behavioral Health Resources in the Community: Intensive Outpatient Programs Organization         Address  Phone  Notes  [facility] [address] [place], [place] [phone]   [facility] Outpatient [address], [place], [place] [phone]   [facility] [address], [place], [place]  [phone]   [facility] [address],  [place], [place] [phone] or [phone]   Substance Abuse Resources Organization         Address  Phone  Notes  [facility]  [phone]   [facility]  [phone]   [place]  [phone]   Floydene Flock  [phone]   Residential & Outpatient Substance Abuse Program  [phone]   Psychological  Services Organization         Address  Phone  Notes  [facility]  [phone]   [facility] Services  [phone]   [facility] Mental Health [address], [place] [phone] or [phone]    Mobile Crisis Teams Organization         Address  Phone  Notes  [facility], Mobile Crisis Care Unit  [phone]   [facility]  [address] [place], [place] [phone]   Doristine Locks [address] [place] [phone]    Self-Help/Support Groups Organization         Address  Phone             Notes  [facility] [place] - variety of support groups  336- I7437963 Call for more information  Narcotics Anonymous (NA), [facility] [address], [employer] [place]  2 meetings at this location   Statistician         Address  Phone  Notes  [facility] [address],    [place]  [phone]   [facility]  [address], [place] [address], [place], [place] [phone]   [facility] [address] [place], [place] [phone] Admissions: 8am-3pm M-F  [facility] [address],    [place], [place] [phone]   [facility] [address] [place], [place], [place] [phone]   [facility] [address],  [place], [place] [phone]   [facility] - Intensive Outpatient [address] Laurell Joseph[address], [place], [place] [phone]   [facility] ([facility] [address] [place]  [place], [place] [phone] or [phone]   [facility] ([facility]) [address] [place], [place] [phone] Accepts Medicaid  [facility] [address],  [place] [phone] Substance Abuse/Addiction Treatment   [facility] Resources Organization         Address  Phone  Notes  Estate manager/land agent Services  (  [phone]   Angie Fava, PhD [address], [place] Knack [place], [place]   [phone] or [phone]   [facility]   [address] [place], [place] [phone]   [facility] [address], [place], [place] [phone] Insurance/Medicaid/sponsorship through [facility] [address]                                    [place], [place] [phone] Therapy/tele-psych/case  [facility] [address], [place] [phone]    Dr. Lolly Mustache  [phone]   [facility] [place]  [employer] [facility] Dept. 1) [address], [place] 2) [address], [place] 3)  [address], [place] [phone]  [phone]   [facility] Child Abuse Hotline 6027312027 or ([phone])683-6401 (After Hours)

## 2015-08-30 DIAGNOSIS — R7301 Impaired fasting glucose: Secondary | ICD-10-CM | POA: Insufficient documentation

## 2015-08-30 DIAGNOSIS — F419 Anxiety disorder, unspecified: Secondary | ICD-10-CM | POA: Insufficient documentation

## 2015-08-30 DIAGNOSIS — G4700 Insomnia, unspecified: Secondary | ICD-10-CM

## 2015-08-30 DIAGNOSIS — E669 Obesity, unspecified: Secondary | ICD-10-CM | POA: Insufficient documentation

## 2015-08-30 DIAGNOSIS — J302 Other seasonal allergic rhinitis: Secondary | ICD-10-CM | POA: Insufficient documentation

## 2015-08-30 DIAGNOSIS — I10 Essential (primary) hypertension: Secondary | ICD-10-CM | POA: Insufficient documentation

## 2015-08-30 DIAGNOSIS — F339 Major depressive disorder, recurrent, unspecified: Secondary | ICD-10-CM

## 2015-08-30 HISTORY — DX: Obesity, unspecified: E66.9

## 2015-08-30 HISTORY — DX: Other seasonal allergic rhinitis: J30.2

## 2015-08-30 HISTORY — DX: Major depressive disorder, recurrent, unspecified: F33.9

## 2015-08-30 HISTORY — DX: Insomnia, unspecified: G47.00

## 2015-08-30 HISTORY — DX: Anxiety disorder, unspecified: F41.9

## 2015-10-08 DIAGNOSIS — Z0000 Encounter for general adult medical examination without abnormal findings: Secondary | ICD-10-CM | POA: Insufficient documentation

## 2015-10-08 HISTORY — DX: Encounter for general adult medical examination without abnormal findings: Z00.00

## 2016-10-23 ENCOUNTER — Emergency Department (HOSPITAL_BASED_OUTPATIENT_CLINIC_OR_DEPARTMENT_OTHER)
Admission: EM | Admit: 2016-10-23 | Discharge: 2016-10-23 | Disposition: A | Discharge status: Home / Self Care | Payer: BLUE CROSS/BLUE SHIELD | Attending: Emergency Medicine | Admitting: Emergency Medicine

## 2016-10-23 ENCOUNTER — Emergency Department (HOSPITAL_BASED_OUTPATIENT_CLINIC_OR_DEPARTMENT_OTHER): Payer: BLUE CROSS/BLUE SHIELD

## 2016-10-23 ENCOUNTER — Encounter (HOSPITAL_BASED_OUTPATIENT_CLINIC_OR_DEPARTMENT_OTHER): Payer: Self-pay

## 2016-10-23 DIAGNOSIS — R197 Diarrhea, unspecified: Secondary | ICD-10-CM | POA: Insufficient documentation

## 2016-10-23 DIAGNOSIS — R112 Nausea with vomiting, unspecified: Secondary | ICD-10-CM | POA: Diagnosis not present

## 2016-10-23 DIAGNOSIS — I10 Essential (primary) hypertension: Secondary | ICD-10-CM | POA: Insufficient documentation

## 2016-10-23 DIAGNOSIS — Z79899 Other long term (current) drug therapy: Secondary | ICD-10-CM | POA: Insufficient documentation

## 2016-10-23 DIAGNOSIS — R1012 Left upper quadrant pain: Secondary | ICD-10-CM | POA: Diagnosis present

## 2016-10-23 LAB — COMPREHENSIVE METABOLIC PANEL
ALT: 16 U/L (ref 14–54)
ANION GAP: 8 (ref 5–15)
AST: 21 U/L (ref 15–41)
Albumin: 4 g/dL (ref 3.5–5.0)
Alkaline Phosphatase: 61 U/L (ref 38–126)
BUN: 7 mg/dL (ref 6–20)
CHLORIDE: 102 mmol/L (ref 101–111)
CO2: 25 mmol/L (ref 22–32)
CREATININE: 0.63 mg/dL (ref 0.44–1.00)
Calcium: 8.8 mg/dL — ABNORMAL LOW (ref 8.9–10.3)
Glucose, Bld: 97 mg/dL (ref 65–99)
POTASSIUM: 3.8 mmol/L (ref 3.5–5.1)
SODIUM: 135 mmol/L (ref 135–145)
Total Bilirubin: 0.5 mg/dL (ref 0.3–1.2)
Total Protein: 7.1 g/dL (ref 6.5–8.1)

## 2016-10-23 LAB — CBC WITH DIFFERENTIAL/PLATELET
BASOS PCT: 0 %
Basophils Absolute: 0 10*3/uL (ref 0.0–0.1)
EOS PCT: 2 %
Eosinophils Absolute: 0.1 10*3/uL (ref 0.0–0.7)
HEMATOCRIT: 34.3 % — AB (ref 36.0–46.0)
Hemoglobin: 11.3 g/dL — ABNORMAL LOW (ref 12.0–15.0)
Lymphocytes Relative: 29 %
Lymphs Abs: 1.9 10*3/uL (ref 0.7–4.0)
MCH: 21.7 pg — AB (ref 26.0–34.0)
MCHC: 32.9 g/dL (ref 30.0–36.0)
MCV: 66 fL — AB (ref 78.0–100.0)
MONO ABS: 0.9 10*3/uL (ref 0.1–1.0)
MONOS PCT: 13 %
NEUTROS PCT: 56 %
Neutro Abs: 3.8 10*3/uL (ref 1.7–7.7)
Platelets: 248 10*3/uL (ref 150–400)
RBC: 5.2 MIL/uL — ABNORMAL HIGH (ref 3.87–5.11)
RDW: 17.4 % — ABNORMAL HIGH (ref 11.5–15.5)
WBC: 6.7 10*3/uL (ref 4.0–10.5)

## 2016-10-23 LAB — URINALYSIS, ROUTINE W REFLEX MICROSCOPIC
Bilirubin Urine: NEGATIVE
GLUCOSE, UA: NEGATIVE mg/dL
Hgb urine dipstick: NEGATIVE
KETONES UR: 15 mg/dL — AB
LEUKOCYTES UA: NEGATIVE
NITRITE: NEGATIVE
Protein, ur: NEGATIVE mg/dL
Specific Gravity, Urine: 1.008 (ref 1.005–1.030)
pH: 6 (ref 5.0–8.0)

## 2016-10-23 LAB — LIPASE, BLOOD: LIPASE: 22 U/L (ref 11–51)

## 2016-10-23 LAB — PREGNANCY, URINE: PREG TEST UR: NEGATIVE

## 2016-10-23 MED ORDER — OMEPRAZOLE 20 MG PO CPDR: 20.0000 mg | DELAYED_RELEASE_CAPSULE | Freq: Every day | ORAL | 0 refills | Status: DC

## 2016-10-23 MED ORDER — CIPROFLOXACIN HCL 500 MG PO TABS: 500.0000 mg | ORAL_TABLET | Freq: Two times a day (BID) | ORAL | 0 refills | Status: DC

## 2016-10-23 MED ORDER — SODIUM CHLORIDE 0.9 % IV BOLUS (SEPSIS)
1000.0000 mL | Freq: Once | INTRAVENOUS | Status: AC
  Administered 2016-10-23: 1000 mL via INTRAVENOUS

## 2016-10-23 MED ORDER — SUCRALFATE 1 G PO TABS
ORAL_TABLET | ORAL | Status: AC
  Filled 2016-10-23: qty 1

## 2016-10-23 MED ORDER — SUCRALFATE 1 G PO TABS
1.0000 g | ORAL_TABLET | Freq: Once | ORAL | Status: AC
  Administered 2016-10-23: 1 g via ORAL
  Filled 2016-10-23: qty 1

## 2016-10-23 MED ORDER — GI COCKTAIL ~~LOC~~
30.0000 mL | Freq: Once | ORAL | Status: AC
  Administered 2016-10-23: 30 mL via ORAL
  Filled 2016-10-23: qty 30

## 2016-10-23 MED ORDER — IOPAMIDOL (ISOVUE-300) INJECTION 61%
100.0000 mL | Freq: Once | INTRAVENOUS | Status: AC | PRN
  Administered 2016-10-23: 100 mL via INTRAVENOUS

## 2016-10-23 MED ORDER — PROMETHAZINE HCL 25 MG PO TABS: 25.0000 mg | ORAL_TABLET | Freq: Four times a day (QID) | ORAL | 0 refills | Status: DC | PRN

## 2016-10-23 MED ORDER — PANTOPRAZOLE SODIUM 40 MG IV SOLR
40.0000 mg | Freq: Once | INTRAVENOUS | Status: AC
  Administered 2016-10-23: 40 mg via INTRAVENOUS
  Filled 2016-10-23: qty 40

## 2016-10-23 MED ORDER — METRONIDAZOLE 500 MG PO TABS: 500.0000 mg | ORAL_TABLET | Freq: Three times a day (TID) | ORAL | 0 refills | Status: DC

## 2016-10-23 MED ORDER — FAMOTIDINE IN NACL 20-0.9 MG/50ML-% IV SOLN
20.0000 mg | Freq: Once | INTRAVENOUS | Status: AC
  Administered 2016-10-23: 20 mg via INTRAVENOUS
  Filled 2016-10-23: qty 50

## 2016-10-23 MED ORDER — ONDANSETRON HCL 4 MG/2ML IJ SOLN
4.0000 mg | Freq: Once | INTRAMUSCULAR | Status: AC
  Administered 2016-10-23: 4 mg via INTRAVENOUS
  Filled 2016-10-23: qty 2

## 2016-10-23 MED ORDER — SUCRALFATE 1 GM/10ML PO SUSP
1.0000 g | Freq: Three times a day (TID) | ORAL | 0 refills | Status: DC
Start: 2016-10-23 — End: 2023-05-31

## 2016-10-23 MED FILL — OMEPRAZOLE DR 20 MG CAPSULE: 20 | 20 days supply | Qty: 20 | Fill #0

## 2016-10-23 MED FILL — CARAFATE 1 GM/10 ML SUSP: 1 | 11 days supply | Qty: 420 | Fill #0

## 2016-10-23 MED FILL — PROMETHAZINE 25 MG TABLET: 25 | 3 days supply | Qty: 10 | Fill #0

## 2016-10-23 MED FILL — metroNIDAZOLE 500 MG TABS: 500 | 7 days supply | Qty: 21 | Fill #0

## 2016-10-23 MED FILL — CIPROFLOXACIN HCL 500 MG TA: 500 | 7 days supply | Qty: 14 | Fill #0

## 2016-10-23 NOTE — ED Provider Notes (Signed)
Complains of crampy abdominal pain accounted by diarrhea approximate 4 episodes per day for the past 2.5 weeks. No fever. Other associated symptoms include nausea without vomiting. She is presently asymptomatic and pain-free. Patient did go [place] recently however diarrhea and symptoms started prior to going [place]. She denies recent antibiotic use   Doug SouJacubowitz, Zethan Alfieri, MD 10/23/16 [id number]

## 2016-10-23 NOTE — ED Triage Notes (Signed)
Pt reports generalized abdominal pain for 2 days after eating [place] food. Attributes symptoms to this. Reports generalized cramping abdominal pain followed by loose stools, associated nausea. Generalized abdominal tenderness. Recent return [place] one week ago. No relief with OTC meds - Tums, Pepto.

## 2016-10-23 NOTE — Discharge Instructions (Signed)
Her lab work is reassuring. Her CT scan does show signs of infection of your intestines. Take antibiotics as prescribed for 7 days. Take the Carafate before meals. Use the Prilosec once a day. May use over-the-counter Imodium to help with diarrhea. Make sure you drink plenty of fluids. Make sure to follow up with your primary care doctor for further referral if necessary. She had her blood pressure rechecked by her primary care doctor. Use the phenergan as needed for nausea and pain. Return to the emergency department if he develops any blood in her stools, fever, worsening vomiting, worsening pain.

## 2016-10-23 NOTE — ED Notes (Signed)
Pt stated that her nausea is coming back after she ate the cracker and cranberry.

## 2016-10-23 NOTE — ED Provider Notes (Signed)
[facility] Provider Note   CSN: [id number] Arrival date & time: 10/23/16  1043     History   Chief Complaint Chief Complaint  Patient presents with  . Abdominal Pain    HPI Casey Palmer is a 46 y.o. female.  HPI 46 yo female with pmh sig for htn presents to the ER today with complaint of luq abd pain, nausea, and diarrhea. Pt states that her abd pain and nausea started 2 days ago after eating chinese food which she attributed to food poisoning. She also reports loose stools for the past 2.5 weeks.  States that this started after changing her diet. She has not tired any otc meds for her symptoms. She does report recent travel to the [place] recently however her diarrhea started before travel. She reports approx 4 episodes of diarrhea per day. Denies any other recent abx use. Abd is cramping and intermittent. No relief with tums or pepto. Nothing makes better. EAting makes the pain worse. Denies any pain at this time. Pain returns with eating.   Pt denies any fever, chill, ha, vision changes, lightheadedness, dizziness, congestion, neck pain, cp, sob, cough, v/d, urinary symptoms, change in bowel habits, melena, hematochezia, lower extremity paresthesias.   Past Medical History:  Diagnosis Date  . Depression   . Hypertension     There are no active problems to display for this patient.   History reviewed. No pertinent surgical history.  OB History    No data available       Home Medications    Prior to Admission medications   Medication Sig Start Date End Date Taking? Authorizing Provider  ciprofloxacin (CIPRO) 500 MG tablet Take 1 tablet (500 mg total) by mouth 2 (two) times daily. 10/23/16   Doristine Devoid, PA-C  diphenhydrAMINE (BENADRYL) 25 MG tablet Take 50 mg by mouth at bedtime. For allergy     [provider]  LOSARTAN POTASSIUM PO Take by mouth.    [provider]  Lysine 1000 MG TABS Take 1 tablet by mouth daily.    [provider]  metroNIDAZOLE (FLAGYL) 500 MG tablet Take 1 tablet (500 mg total) by mouth 3 (three) times daily. DO NOT CONSUME ALCOHOL WHILE TAKING THIS MEDICATION. 10/23/16   Ocie Cornfield T, PA-C  MOVIPREP 100 G SOLR Take 1 kit (100 g total) by mouth once. 10/09/11   Ladene Artist, MD  Multiple Vitamins-Minerals (MULTIVITAMINS THER. W/MINERALS) TABS Take 1 tablet by mouth every evening.      [provider]  naphazoline (CLEAR EYES) 0.012 % ophthalmic solution Place 2 drops into both eyes 2 (two) times daily as needed. For red dry eyes     [provider]  nitrofurantoin, macrocrystal-monohydrate, (MACROBID) 100 MG capsule Take 1 capsule (100 mg total) by mouth 2 (two) times daily. 02/02/15   Joy, Shawn C, PA-C  omeprazole (PRILOSEC) 20 MG capsule Take 1 capsule (20 mg total) by mouth daily. 10/23/16   Doristine Devoid, PA-C  phenazopyridine (PYRIDIUM) 200 MG tablet Take 1 tablet (200 mg total) by mouth 3 (three) times daily. 02/02/15   Joy, Shawn C, PA-C  promethazine (PHENERGAN) 25 MG tablet Take 1 tablet (25 mg total) by mouth every 6 (six) hours as needed for nausea or vomiting. 10/23/16   Doristine Devoid, PA-C  sucralfate (CARAFATE) 1 GM/10ML suspension Take 10 mLs (1 g total) by mouth 4 (four) times daily -  with meals and at bedtime. 10/23/16   Leaphart,  Zack Seal, PA-C    Family History Family History  Problem Relation Age of Onset  . Ovarian cancer Mother   . Diabetes Maternal Grandmother   . Hypertension Unknown        Grandparents  . Colon cancer Neg Hx     Social History Social History  Substance Use Topics  . Smoking status: Never Smoker  . Smokeless tobacco: Never Used  . Alcohol use 1.2 oz/week    2 Glasses of wine per week     Allergies   Patient has no known allergies.   Review of Systems Review of Systems  Constitutional: Negative for chills and fever.  HENT: Negative for congestion.   Eyes: Negative for visual disturbance.    Respiratory: Negative for cough and shortness of breath.   Cardiovascular: Negative for chest pain.  Gastrointestinal: Positive for abdominal pain, diarrhea and nausea. Negative for constipation and vomiting.  Genitourinary: Negative for dysuria, flank pain, frequency, hematuria, urgency, vaginal bleeding and vaginal discharge.  Musculoskeletal: Negative for arthralgias and myalgias.  Skin: Negative for rash.  Neurological: Negative for dizziness, syncope, weakness, light-headedness, numbness and headaches.  Psychiatric/Behavioral: Negative for sleep disturbance. The patient is not nervous/anxious.      Physical Exam Updated Vital Signs BP (!) 150/84 (BP Location: Right Arm)   Pulse 70   Temp 98.4 F (36.9 C) (Oral)   Resp 18   Ht 5' 3.5" (1.613 m)   Wt 81.6 kg (180 lb)   LMP 10/09/2016   SpO2 100%   BMI 31.39 kg/m   Physical Exam  Constitutional: She is oriented to person, place, and time. She appears well-developed and well-nourished.  Non-toxic appearance. No distress.  HENT:  Head: Normocephalic and atraumatic.  Nose: Nose normal.  Mouth/Throat: Oropharynx is clear and moist.  Eyes: Conjunctivae are normal. Pupils are equal, round, and reactive to light. Right eye exhibits no discharge. Left eye exhibits no discharge.  Neck: Normal range of motion. Neck supple.  Cardiovascular: Normal rate, regular rhythm, normal heart sounds and intact distal pulses.   Pulmonary/Chest: Effort normal and breath sounds normal. No respiratory distress. She exhibits no tenderness.  Abdominal: Soft. Bowel sounds are normal. There is tenderness in the epigastric area and left upper quadrant. There is no rigidity, no rebound, no guarding, no CVA tenderness, no tenderness at McBurney's point and negative Murphy's sign.  Musculoskeletal: Normal range of motion. She exhibits no tenderness.  Lymphadenopathy:    She has no cervical adenopathy.  Neurological: She is alert and oriented to person,  place, and time.  Skin: Skin is warm and dry. Capillary refill takes less than 2 seconds.  Psychiatric: Her behavior is normal. Judgment and thought content normal.  Nursing note and vitals reviewed.    ED Treatments / Results  Labs (all labs ordered are listed, but only abnormal results are displayed) Labs Reviewed  COMPREHENSIVE METABOLIC PANEL - Abnormal; Notable for the following:       Result Value   Calcium 8.8 (*)    All other components within normal limits  URINALYSIS, ROUTINE W REFLEX MICROSCOPIC - Abnormal; Notable for the following:    Ketones, ur 15 (*)    All other components within normal limits  CBC WITH DIFFERENTIAL/PLATELET - Abnormal; Notable for the following:    RBC 5.20 (*)    Hemoglobin 11.3 (*)    HCT 34.3 (*)    MCV 66.0 (*)    MCH 21.7 (*)    RDW 17.4 (*)  All other components within normal limits  LIPASE, BLOOD  PREGNANCY, URINE  CBC WITH DIFFERENTIAL/PLATELET    EKG  EKG Interpretation None       Radiology Ct Abdomen Pelvis W Contrast  Result Date: 10/23/2016 CLINICAL DATA:  Epigastric and LEFT lower quadrant pain for 2 days, loose stools, nausea, generalized abdominal tenderness, history hypertension, negative pregnancy test EXAM: CT ABDOMEN AND PELVIS WITH CONTRAST TECHNIQUE: Multidetector CT imaging of the abdomen and pelvis was performed using the standard protocol following bolus administration of intravenous contrast. CONTRAST:  125m ISOVUE-300 IOPAMIDOL (ISOVUE-300) INJECTION 61% IV. No oral contrast administered COMPARISON:  02/02/2015 FINDINGS: Lower chest: Lung bases clear Hepatobiliary: Gallbladder and liver normal appearance Pancreas: Normal appearance Spleen: Normal appearance Adrenals/Urinary Tract: Adrenal glands, kidneys, ureters, and bladder normal appearance Stomach/Bowel: Normal appendix. Wall thickening of the transverse colon extending into proximal descending colon consistent with colitis. Stomach and bowel loops otherwise  normal appearance. Vascular/Lymphatic: Aorta normal caliber.  No adenopathy. Reproductive: Low attenuation within uterus question prominent endometrial complex. RIGHT fundal probable leiomyoma 3.7 cm in size. Additional suspected subserosal leiomyoma at mid uterus inferiorly, 4.5 cm greatest size. Adnexa unremarkable. Other: No free air free fluid. Small RIGHT inguinal hernia containing fat. Musculoskeletal: No acute osseous findings. IMPRESSION: Wall thickening of the transverse colon extending to proximal descending colon compatible with colitis ; differential diagnosis includes infection, inflammatory bowel disease, unlikely ischemia due the lack of additional vascular disease findings. Probable uterine leiomyomata, at least one of which appears to extend submucosal. Electronically Signed   By: MLavonia DanaM.D.   On: 10/23/2016 16:00    Procedures Procedures (including critical care time)  Medications Ordered in ED Medications  sodium chloride 0.9 % bolus 1,000 mL (0 mLs Intravenous Stopped 10/23/16 1350)  gi cocktail (Maalox,Lidocaine,Donnatal) (30 mLs Oral Given 10/23/16 1305)  pantoprazole (PROTONIX) injection 40 mg (40 mg Intravenous Given 10/23/16 1305)  famotidine (PEPCID) IVPB 20 mg premix (0 mg Intravenous Stopped 10/23/16 1349)  sucralfate (CARAFATE) tablet 1 g (1 g Oral Given 10/23/16 1305)  ondansetron (ZOFRAN) injection 4 mg (4 mg Intravenous Given 10/23/16 1318)  ondansetron (ZOFRAN) injection 4 mg (4 mg Intravenous Given 10/23/16 1529)  iopamidol (ISOVUE-300) 61 % injection 100 mL (100 mLs Intravenous Contrast Given 10/23/16 1541)     Initial Impression / Assessment and Plan / ED Course  I have reviewed the triage vital signs and the nursing notes.  Pertinent labs & imaging results that were available during my care of the patient were reviewed by me and considered in my medical decision making (see chart for details).     Pt presents to the ED with complaint of LUQ abd pain, nausea, and  dirrhea. [place]. Denies any associated blood in stool, emesis or fevers. VS normal. Afebrile and not tachycardic or hypotensive.   Abd exam with LUQ tenderess. No RUQ tenderness. Negative murphy sign.   Labs reassuring. Lipase normal. No leukocytosis. Stable hgb. Liver enzymes and creatine normal. [place]ows no signs of infection.   Pt with continued nausea but no pain after meds and po challenge. CT scan ordered. Showed signs of colitis likey due to infectious vs inflammatory. No signs of pancreatitis or cholecystitis.   Repeat abd exam is benign. Pt states her symptoms have improved and is abel to tolerate po fluids without nausea. Will treat pt with cipro and flagyl given length of diarrhea. Pt symptoms possibly due to PUD or gastritis. Will treat  symptomatically. Pt does not meet SIRS or SEPSIS criteria.   Pt is hemodynamically stable, in NAD, & able to ambulate in the ED. Evaluation does not show pathology that would require ongoing emergent intervention or inpatient treatment. I explained the diagnosis to the patient. Pain has been managed & has no complaints prior to dc. Pt is comfortable with above plan and is stable for discharge at this time. All questions were answered prior to disposition. Strict return precautions for f/u to the ED were discussed. Encouraged follow up with PCP. Pt seen and eval by Dr. Norlene Campbell who is agreeable to the above plan.  Final Clinical Impressions(s) / ED Diagnoses   Final diagnoses:  Nausea vomiting and diarrhea    New Prescriptions Discharge Medication List as of 10/23/2016  4:40 PM    START taking these medications   Details  ciprofloxacin (CIPRO) 500 MG tablet Take 1 tablet (500 mg total) by mouth 2 (two) times daily., Starting Mon 10/23/2016, Print    metroNIDAZOLE (FLAGYL) 500 MG tablet Take 1 tablet (500 mg total) by mouth 3 (three) times daily. DO NOT CONSUME ALCOHOL WHILE TAKING THIS  MEDICATION., Starting Mon 10/23/2016, Print    omeprazole (PRILOSEC) 20 MG capsule Take 1 capsule (20 mg total) by mouth daily., Starting Mon 10/23/2016, Print    promethazine (PHENERGAN) 25 MG tablet Take 1 tablet (25 mg total) by mouth every 6 (six) hours as needed for nausea or vomiting., Starting Mon 10/23/2016, Print    sucralfate (CARAFATE) 1 GM/10ML suspension Take 10 mLs (1 g total) by mouth 4 (four) times daily -  with meals and at bedtime., Starting Mon 10/23/2016, Print         Doristine Devoid, PA-C 10/23/16 2211    Orlie Dakin, MD 10/25/16 [phone]

## 2017-02-27 DIAGNOSIS — N943 Premenstrual tension syndrome: Secondary | ICD-10-CM

## 2017-02-27 HISTORY — DX: Premenstrual tension syndrome: N94.3

## 2017-04-26 DIAGNOSIS — Z8041 Family history of malignant neoplasm of ovary: Secondary | ICD-10-CM | POA: Insufficient documentation

## 2017-04-26 HISTORY — DX: Family history of malignant neoplasm of ovary: Z80.41

## 2017-07-22 ENCOUNTER — Emergency Department (HOSPITAL_BASED_OUTPATIENT_CLINIC_OR_DEPARTMENT_OTHER)
Admission: EM | Admit: 2017-07-22 | Discharge: 2017-07-22 | Disposition: A | Discharge status: Home / Self Care | Payer: BLUE CROSS/BLUE SHIELD | Attending: Emergency Medicine | Admitting: Emergency Medicine

## 2017-07-22 ENCOUNTER — Encounter (HOSPITAL_BASED_OUTPATIENT_CLINIC_OR_DEPARTMENT_OTHER): Payer: Self-pay | Admitting: Emergency Medicine

## 2017-07-22 ENCOUNTER — Other Ambulatory Visit: Payer: Self-pay

## 2017-07-22 DIAGNOSIS — M545 Low back pain, unspecified: Secondary | ICD-10-CM

## 2017-07-22 DIAGNOSIS — Z79899 Other long term (current) drug therapy: Secondary | ICD-10-CM | POA: Insufficient documentation

## 2017-07-22 DIAGNOSIS — I10 Essential (primary) hypertension: Secondary | ICD-10-CM | POA: Insufficient documentation

## 2017-07-22 MED ORDER — DEXAMETHASONE 6 MG PO TABS
6.0000 mg | ORAL_TABLET | Freq: Once | ORAL | Status: AC
  Administered 2017-07-22: 6 mg via ORAL
  Filled 2017-07-22: qty 1

## 2017-07-22 MED ORDER — METHOCARBAMOL 500 MG PO TABS
500.0000 mg | ORAL_TABLET | Freq: Two times a day (BID) | ORAL | 0 refills | Status: DC
Start: 2017-07-22 — End: 2023-05-31

## 2017-07-22 MED ORDER — KETOROLAC TROMETHAMINE 30 MG/ML IJ SOLN
30.0000 mg | Freq: Once | INTRAMUSCULAR | Status: AC
  Administered 2017-07-22: 30 mg via INTRAMUSCULAR
  Filled 2017-07-22: qty 1

## 2017-07-22 NOTE — Discharge Instructions (Addendum)
Please read instructions below.  Talk with your PCP about any new medications.  You can take aleve or ibuprofen for pain. Take 2 tablets of aleve every 12 hours OR take 600mg  of ibuprofen every 6 hours. You can take tylenol as well. Take robaxin every 12 hours as needed for muscle spasm. Apply ice or heat to your back for additional relief. Avoid heavy lifting until your symptoms improve. Schedule an appointment with your primary care provider to recheck your blood pressure as it is elevated today.   Return to ER if new numbness or tingling in your arms or legs, inability to urinate, inability to hold your bowels, or weakness in your extremities.

## 2017-07-22 NOTE — ED Triage Notes (Signed)
Mid lower back pain since yesterday. Denies injury.

## 2017-07-22 NOTE — ED Provider Notes (Signed)
[facility] EMERGENCY DEPARTMENT Provider Note   CSN: [id number] Arrival date & time: 07/22/17  1046     History   Chief Complaint Chief Complaint  Patient presents with  . Back Pain    HPI Casey Palmer is a 47 y.o. female w PMHx HTN, depression, presenting to the ED with acute onset of b/l low back pain that began yesterday.  Patient states she mowed her lawn, and began feeling gradually worsening pain in her lower back after that.  She states she took ibuprofen, however was still in significant pain.  She localizes pain to the bilateral lower back musculature that is worse with movement.  She states certain movements cause shooting pains.  States similar episode happened about 1 year ago which she treated with NSAIDs and rest.  She denies associated abdominal pain, urinary symptoms, bowel or bladder incontinence, numbness or weakness, fever.  Denies history of drug use, cancer.  States she no longer takes blood pressure medication as she treated it with diet and improvement in her weight.  The history is provided by the patient.    Past Medical History:  Diagnosis Date  . Depression   . Hypertension     There are no active problems to display for this patient.   History reviewed. No pertinent surgical history.   OB History   None      Home Medications    Prior to Admission medications   Medication Sig Start Date End Date Taking? Authorizing Provider  ciprofloxacin (CIPRO) 500 MG tablet Take 1 tablet (500 mg total) by mouth 2 (two) times daily. 10/23/16   Doristine Devoid, PA-C  diphenhydrAMINE (BENADRYL) 25 MG tablet Take 50 mg by mouth at bedtime. For allergy     [provider]  LOSARTAN POTASSIUM PO Take by mouth.    [provider]  Lysine 1000 MG TABS Take 1 tablet by mouth daily.    [provider]  methocarbamol (ROBAXIN) 500 MG tablet Take 1 tablet (500 mg total) by mouth 2 (two) times daily. 07/22/17   Verlena Marlette, [place]  N, PA-C  metroNIDAZOLE (FLAGYL) 500 MG tablet Take 1 tablet (500 mg total) by mouth 3 (three) times daily. DO NOT CONSUME ALCOHOL WHILE TAKING THIS MEDICATION. 10/23/16   Ocie Cornfield T, PA-C  MOVIPREP 100 G SOLR Take 1 kit (100 g total) by mouth once. 10/09/11   Ladene Artist, MD  Multiple Vitamins-Minerals (MULTIVITAMINS THER. W/MINERALS) TABS Take 1 tablet by mouth every evening.      [provider]  naphazoline (CLEAR EYES) 0.012 % ophthalmic solution Place 2 drops into both eyes 2 (two) times daily as needed. For red dry eyes     [provider]  nitrofurantoin, macrocrystal-monohydrate, (MACROBID) 100 MG capsule Take 1 capsule (100 mg total) by mouth 2 (two) times daily. 02/02/15   Joy, Shawn C, PA-C  omeprazole (PRILOSEC) 20 MG capsule Take 1 capsule (20 mg total) by mouth daily. 10/23/16   Doristine Devoid, PA-C  phenazopyridine (PYRIDIUM) 200 MG tablet Take 1 tablet (200 mg total) by mouth 3 (three) times daily. 02/02/15   Joy, Shawn C, PA-C  promethazine (PHENERGAN) 25 MG tablet Take 1 tablet (25 mg total) by mouth every 6 (six) hours as needed for nausea or vomiting. 10/23/16   Doristine Devoid, PA-C  sucralfate (CARAFATE) 1 GM/10ML suspension Take 10 mLs (1 g total) by mouth 4 (four) times daily -  with meals and at bedtime. 10/23/16  Doristine Devoid PA-C    Family History Family History  Problem Relation Age of Onset  . Ovarian cancer Mother   . Diabetes Maternal Grandmother   . Hypertension Unknown        Grandparents  . Colon cancer Neg Hx     Social History Social History   Tobacco Use  . Smoking status: Never Smoker  . Smokeless tobacco: Never Used  Substance Use Topics  . Alcohol use: Yes    Alcohol/week: 1.2 oz    Types: 2 Glasses of wine per week  . Drug use: No     Allergies   Patient has no known allergies.   Review of Systems Review of Systems  Constitutional: Negative for fever.  Gastrointestinal: Negative for abdominal  pain.  Genitourinary: Negative for dysuria and frequency.  Musculoskeletal: Positive for back pain.  Neurological: Negative for weakness and numbness.  All other systems reviewed and are negative.    Physical Exam Updated Vital Signs BP (!) 176/113   Pulse 94   Temp 98.7 F (37.1 C) (Oral)   Resp 18   Ht 5' 4"  (1.626 m)   Wt 84.8 kg (187 lb)   LMP 06/22/2017   SpO2 98%   BMI 32.10 kg/m   Physical Exam  Constitutional: She appears well-developed and well-nourished. No distress.  HENT:  Head: Normocephalic and atraumatic.  Eyes: Conjunctivae are normal.  Cardiovascular: Normal rate, regular rhythm, normal heart sounds and intact distal pulses.  Pulmonary/Chest: Effort normal and breath sounds normal. No respiratory distress.  Abdominal: Soft. Bowel sounds are normal. She exhibits no distension and no mass. There is no tenderness. There is no guarding.  Musculoskeletal:       Back:  No midline spinal tenderness, no bony step-offs or gross deformities.  Bilateral lower back musculature with tenderness and palpated spasm.  Neurological: She is alert.  Motor:  Normal tone. 5/5 in lower extremities bilaterally including strong and equal dorsiflexion/plantar flexion Sensory: Pinprick and light touch normal in BLE extremities.  Deep Tendon Reflexes: 2+ and symmetric in the b/l patella Gait: normal gait and balance CV: distal pulses palpable throughout    Skin: Skin is warm.  Psychiatric: She has a normal mood and affect. Her behavior is normal.  Nursing note and vitals reviewed.    ED Treatments / Results  Labs (all labs ordered are listed, but only abnormal results are displayed) Labs Reviewed - No data to display  EKG None  Radiology No results found.  Procedures Procedures (including critical care time)  Medications Ordered in ED Medications  ketorolac (TORADOL) 30 MG/ML injection 30 mg (30 mg Intramuscular Given 07/22/17 1209)  dexamethasone (DECADRON)  tablet 6 mg (6 mg Oral Given 07/22/17 1209)     Initial Impression / Assessment and Plan / ED Course  I have reviewed the triage vital signs and the nursing notes.  Pertinent labs & imaging results that were available during my care of the patient were reviewed by me and considered in my medical decision making (see chart for details).     Patient with acute onset of lower back pain after mowing the lawn yesteday.  No neurological deficits and normal neuro exam. No midline spinal tenderness. Patient can walk but states is painful.  No loss of bowel or bladder control.  No concern for cauda equina.  No fever, night sweats, weight loss, h/o cancer, IVDU. Pt is hypertensive in triage, and is not currently on medicatons any more for her BP. No  abd pain, no urinary sx. Pain treated in the ED. recheck BP improved to 153/90. Recommended pt follow up with PCP for BP recheck.  RICE protocol and pain medicine indicated and discussed with patient.   Discussed results, findings, treatment and follow up. Patient advised of return precautions. Patient verbalized understanding and agreed with plan.  Final Clinical Impressions(s) / ED Diagnoses   Final diagnoses:  Acute bilateral low back pain without sciatica    ED Discharge Orders        Ordered    methocarbamol (ROBAXIN) 500 MG tablet  2 times daily     07/22/17 1242       Shreya Lacasse, [place] N, [place] 07/22/17 1319    Tegeler, Gwenyth Allegra, MD 07/22/17 [phone]

## 2017-07-22 NOTE — ED Notes (Signed)
ED Provider at bedside. 

## 2017-12-31 IMAGING — CT CT ABD-PELV W/ CM
2 of 5 series · 17 of 46 positions shown, 19 images · IV contrast (APPLIED)
Comparison: 02/02/2015

CLINICAL DATA: Epigastric and LEFT lower quadrant pain for 2 days,
loose stools, nausea, generalized abdominal tenderness, history
hypertension, negative pregnancy test

EXAM:
CT ABDOMEN AND PELVIS WITH CONTRAST
TECHNIQUE: Multidetector CT imaging of the abdomen and pelvis was performed
using the standard protocol following bolus administration of
intravenous contrast.
CONTRAST:  100mL TOJQAB-HZZ IOPAMIDOL (TOJQAB-HZZ) INJECTION 61% IV.
No oral contrast administered

[Series 2: axial st · axial · 0.86mm/px · z∈[+668,+1053]mm · 14 of 87 slices shown, 16 images]
[im 5/87  soft-tissue]
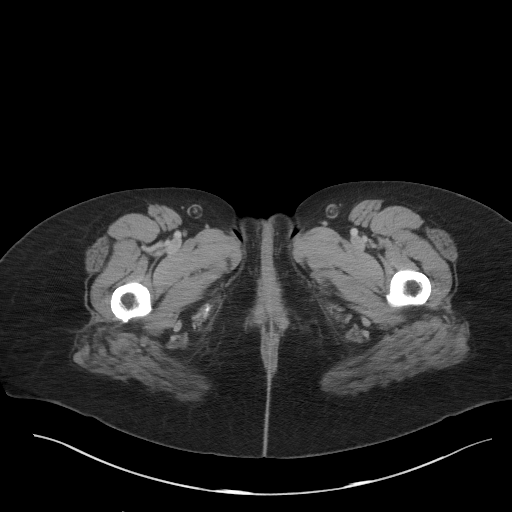
[im 5/87  bone]
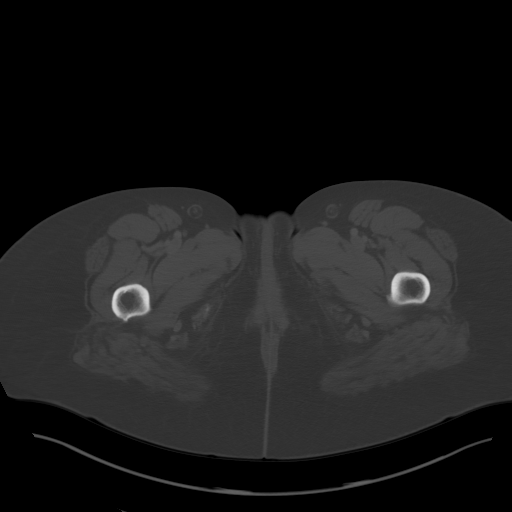
[im 13/87  soft-tissue]
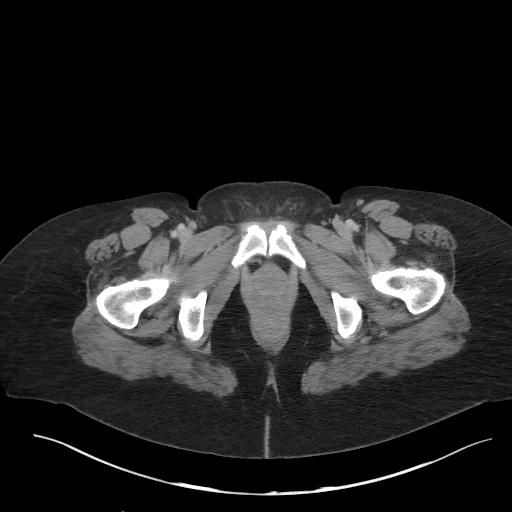
[im 18/87  soft-tissue]
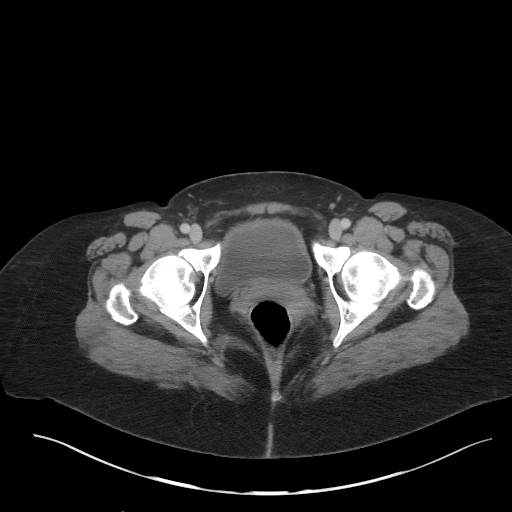
[im 22/87  soft-tissue]
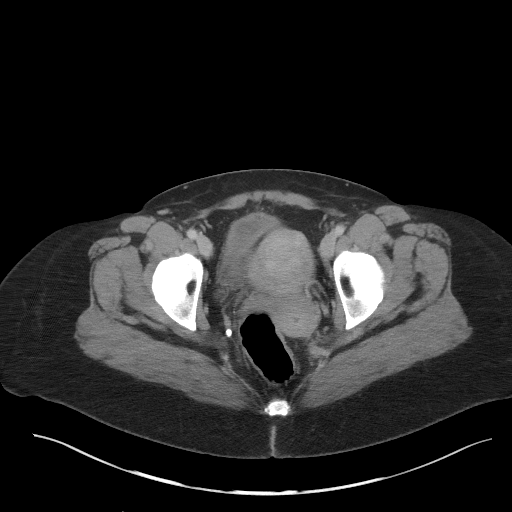
[im 31/87  soft-tissue]
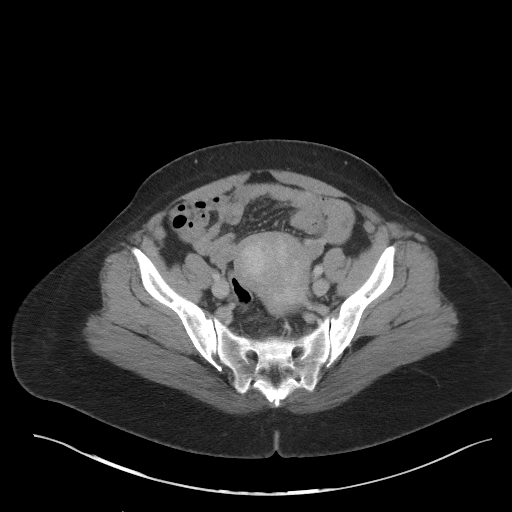
[im 35/87  soft-tissue]
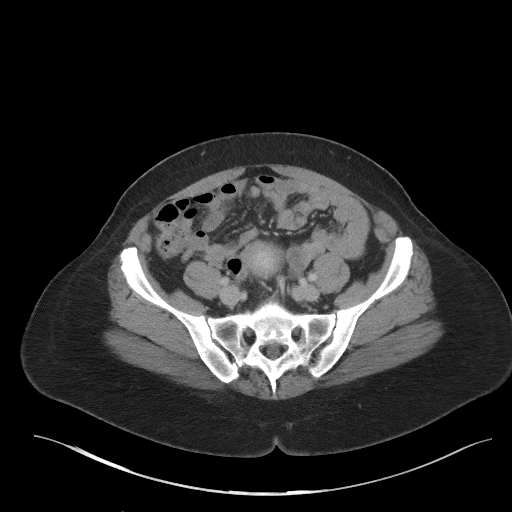
[im 39/87  soft-tissue]
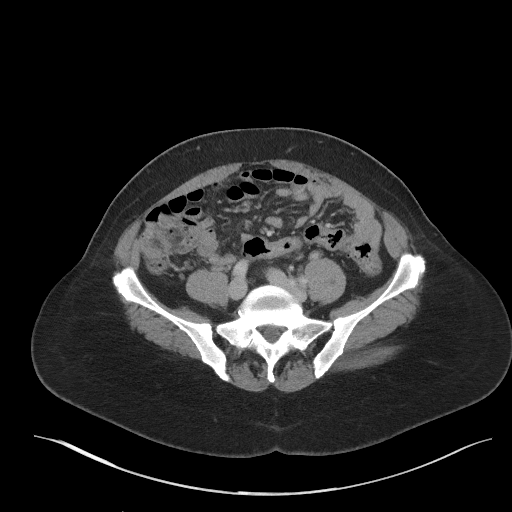
[im 48/87  soft-tissue]
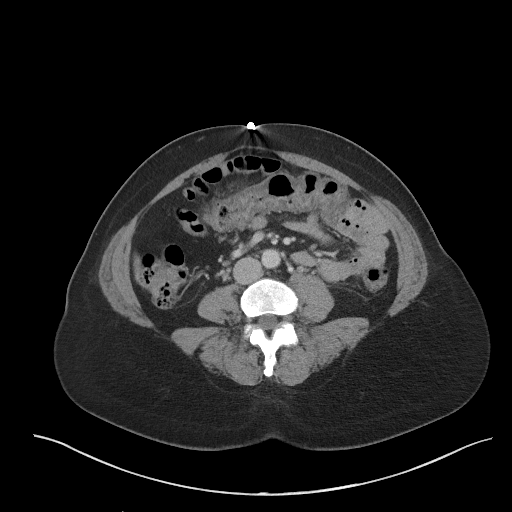
[im 52/87  soft-tissue]
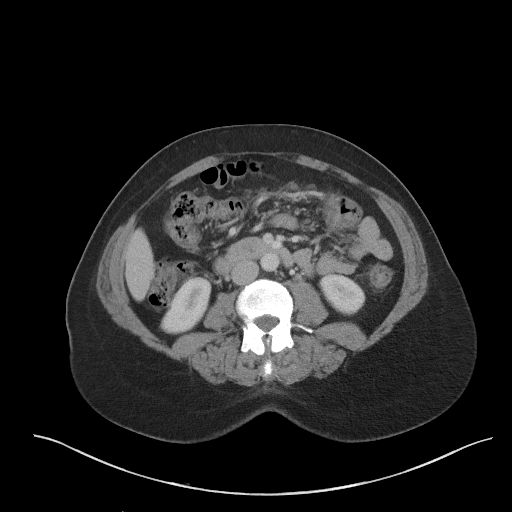
[im 52/87  bone]
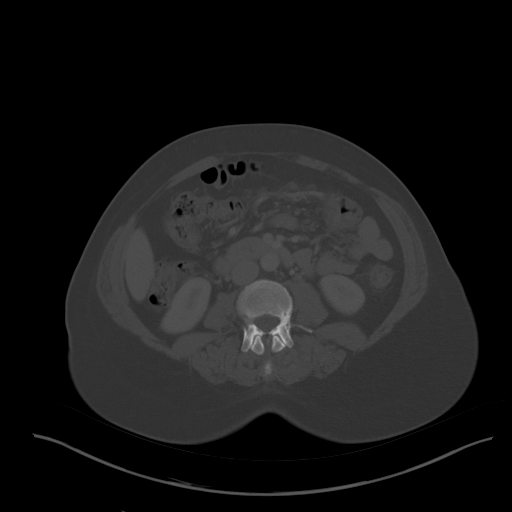
[im 56/87  soft-tissue]
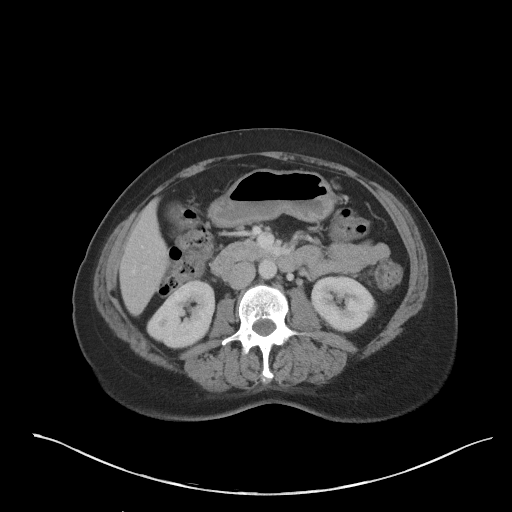
[im 65/87  soft-tissue]
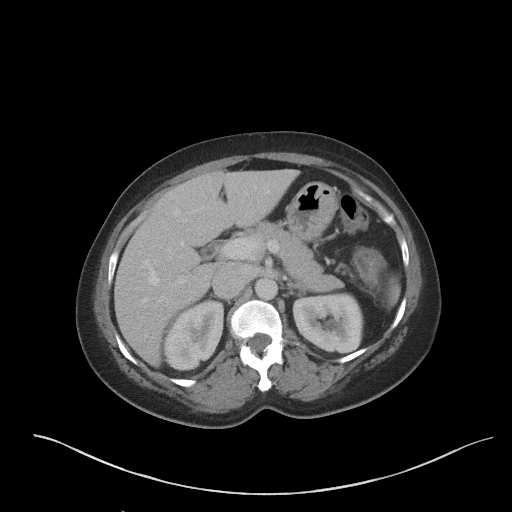
[im 69/87  soft-tissue]
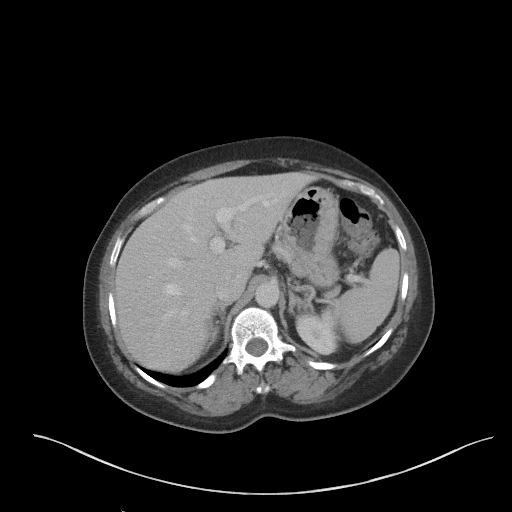
[im 74/87  soft-tissue]
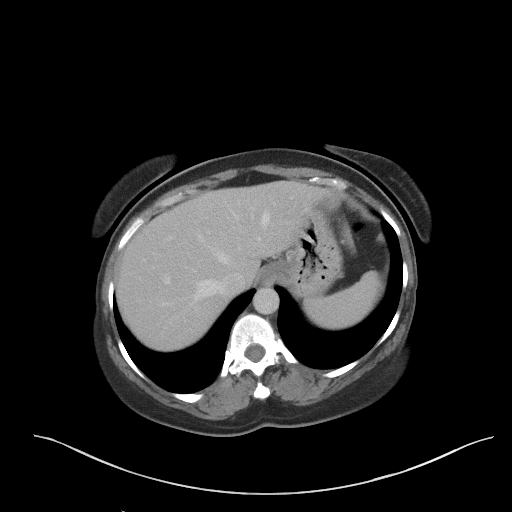
[im 82/87  soft-tissue]
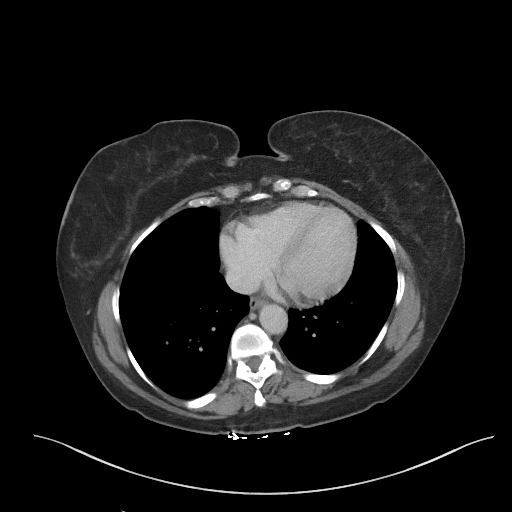

[Series 5: coronal st · coronal · 0.87mm/px · 3 of 83 slices shown]
[im 28/83  soft-tissue]
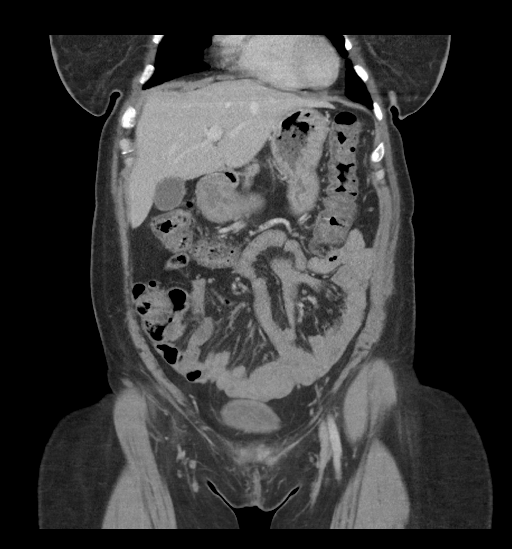
[im 37/83  soft-tissue]
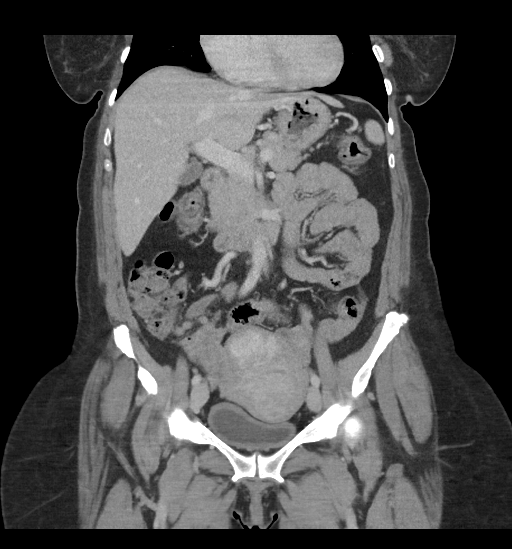
[im 46/83  soft-tissue]
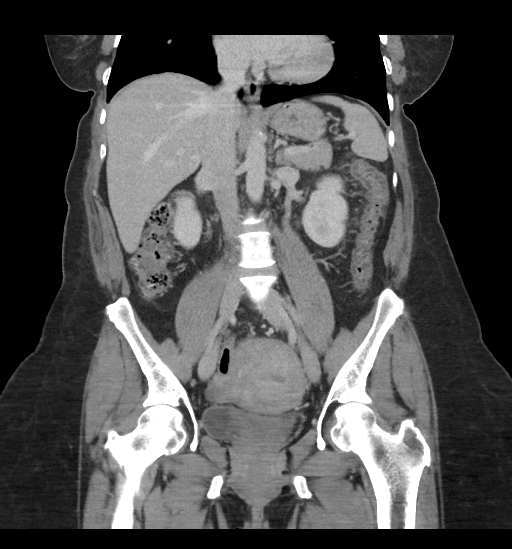

[17 of 46 positions shown; findings below may reference images not displayed]

FINDINGS: Lower chest: Lung bases clear

Hepatobiliary: Gallbladder and liver normal appearance

Pancreas: Normal appearance

Spleen: Normal appearance

Adrenals/Urinary Tract: Adrenal glands, kidneys, ureters, and
bladder normal appearance

Stomach/Bowel: Normal appendix. Wall thickening of the transverse
colon extending into proximal descending colon consistent with
colitis. Stomach and bowel loops otherwise normal appearance.

Vascular/Lymphatic: Aorta normal caliber.  No adenopathy.

Reproductive: Low attenuation within uterus question prominent
endometrial complex. RIGHT fundal probable leiomyoma 3.7 cm in size.
Additional suspected subserosal leiomyoma at mid uterus inferiorly,
4.5 cm greatest size. Adnexa unremarkable.

Other: No free air free fluid. Small RIGHT inguinal hernia
containing fat.

Musculoskeletal: No acute osseous findings.
IMPRESSION: Wall thickening of the transverse colon extending to proximal
descending colon compatible with colitis ; differential diagnosis
includes infection, inflammatory bowel disease, unlikely ischemia
due the lack of additional vascular disease findings.

Probable uterine leiomyomata, at least one of which appears to
extend submucosal.

## 2019-10-22 DIAGNOSIS — Z09 Encounter for follow-up examination after completed treatment for conditions other than malignant neoplasm: Secondary | ICD-10-CM | POA: Insufficient documentation

## 2019-10-22 HISTORY — DX: Encounter for follow-up examination after completed treatment for conditions other than malignant neoplasm: Z09

## 2022-01-10 ENCOUNTER — Encounter: Payer: No Typology Code available for payment source | Admitting: Obstetrics and Gynecology

## 2023-02-26 DIAGNOSIS — E559 Vitamin D deficiency, unspecified: Secondary | ICD-10-CM | POA: Insufficient documentation

## 2023-02-26 HISTORY — DX: Vitamin D deficiency, unspecified: E55.9

## 2023-05-31 ENCOUNTER — Ambulatory Visit
Admission: EM | Admit: 2023-05-31 | Discharge: 2023-05-31 | Disposition: A | Discharge status: Home / Self Care | Payer: No Typology Code available for payment source | Attending: Emergency Medicine | Admitting: Emergency Medicine

## 2023-05-31 ENCOUNTER — Other Ambulatory Visit: Payer: Self-pay

## 2023-05-31 ENCOUNTER — Encounter: Payer: Self-pay | Admitting: Emergency Medicine

## 2023-05-31 DIAGNOSIS — R7303 Prediabetes: Secondary | ICD-10-CM

## 2023-05-31 DIAGNOSIS — Z862 Personal history of diseases of the blood and blood-forming organs and certain disorders involving the immune mechanism: Secondary | ICD-10-CM

## 2023-05-31 DIAGNOSIS — R5383 Other fatigue: Secondary | ICD-10-CM

## 2023-05-31 NOTE — Discharge Instructions (Signed)
 During your visit today, we checked a complete blood cell count, and iron level and your hemoglobin A1c to evaluate for possible causes of your fatigue.  We will contact you with those results once we receive them.  I anticipate they will be back by tomorrow afternoon.  Please consider rescheduling her appointment with your primary care provider so you can follow-up on any abnormal lab findings, if necessary.  Thank you for visiting [facility] today.

## 2023-05-31 NOTE — ED Triage Notes (Signed)
 Pt c/o increase fatigue and leg muscle pain for the last 4 days. Denies any fall or injury.

## 2023-05-31 NOTE — ED Provider Notes (Signed)
 [facility]    CSN: [id number] Arrival date & time: 05/31/23  1234    HISTORY   Chief Complaint  Patient presents with   Fatigue   HPI Casey Palmer is a pleasant, 53 y.o. female who presents to urgent care today. Patient complains of limb heaviness, muscle aching and fatigue for the past 4 days.  Patient denies recent injury, known sick contacts, fever, body aches, chills, nausea, vomiting, diarrhea.  Patient reports a history of iron deficiency anemia and prediabetes, last labs performed over 6 months ago, states her iron was low at the time and her A1c was borderline.  Patient states she has not been taking an iron supplement, not currently exercising or following a low carbohydrate diet.  The history is provided by the patient.   Past Medical History:  Diagnosis Date   Depression    Hypertension    There are no active problems to display for this patient.  History reviewed. No pertinent surgical history. OB History   No obstetric history on file.    Home Medications    Prior to Admission medications   Medication Sig Start Date End Date Taking? Authorizing Provider  diphenhydrAMINE (BENADRYL) 25 MG tablet Take 50 mg by mouth at bedtime. For allergy     [provider]  LOSARTAN POTASSIUM PO Take by mouth.    [provider]  Lysine 1000 MG TABS Take 1 tablet by mouth daily.    [provider]  Multiple Vitamins-Minerals (MULTIVITAMINS THER. W/MINERALS) TABS Take 1 tablet by mouth every evening.      [provider]  naphazoline (CLEAR EYES) 0.012 % ophthalmic solution Place 2 drops into both eyes 2 (two) times daily as needed. For red dry eyes     [provider]    Family History Family History  Problem Relation Age of Onset   Ovarian cancer Mother    Diabetes Maternal Grandmother    Hypertension Unknown        Grandparents   Colon cancer Neg Hx    Social History Social History   Tobacco Use   Smoking  status: Never   Smokeless tobacco: Never  Substance Use Topics   Alcohol use: Yes    Alcohol/week: 2.0 standard drinks of alcohol    Types: 2 Glasses of wine per week   Drug use: No   Allergies   Patient has no known allergies.  Review of Systems Review of Systems Pertinent findings revealed after performing a 14 point review of systems has been noted in the history of present illness.  Physical Exam Vital Signs BP (!) 146/91 (BP Location: Right Arm)   Pulse 84   Temp 97.6 F (36.4 C) (Oral)   Resp 18   LMP 06/22/2017   SpO2 99%   No data found.  Physical Exam Vitals and nursing note reviewed.  Constitutional:      General: She is not in acute distress.    Appearance: Normal appearance. She is obese. She is not ill-appearing or diaphoretic.  HENT:     Head: Normocephalic and atraumatic.  Eyes:     Pupils: Pupils are equal, round, and reactive to light.  Cardiovascular:     Rate and Rhythm: Normal rate and regular rhythm.  Pulmonary:     Effort: Pulmonary effort is normal.     Breath sounds: Normal breath sounds.  Musculoskeletal:        General: Normal range of motion.     Cervical back: Normal  range of motion and neck supple.  Skin:    General: Skin is warm and dry.  Neurological:     General: No focal deficit present.     Mental Status: She is alert and oriented to person, place, and time. Mental status is at baseline.  Psychiatric:        Mood and Affect: Mood normal.        Behavior: Behavior normal.        Thought Content: Thought content normal.        Judgment: Judgment normal.     Visual Acuity Right Eye Distance:   Left Eye Distance:   Bilateral Distance:    Right Eye Near:   Left Eye Near:    Bilateral Near:     UC Couse / Diagnostics / Procedures:     Radiology No results found.  Procedures Procedures (including critical care time) EKG  Pending results:  Labs Reviewed  CBC WITH DIFFERENTIAL/PLATELET  IRON AND TIBC  HEMOGLOBIN  A1C    Medications Ordered in UC: Medications - No data to display  UC Diagnoses / Final Clinical Impressions(s)   I have reviewed the triage vital signs and the nursing notes.  Pertinent labs & imaging results that were available during my care of the patient were reviewed by me and considered in my medical decision making (see chart for details).    Final diagnoses:  Fatigue, unspecified type  History of iron deficiency anemia  Prediabetes   Physical exam today is unremarkable.  Obtained CBC, iron level and A1c.  Will notify patient of results once received.  Patient advised to follow-up with PCP regarding fatigue and lab results.  Please see discharge instructions below for details of plan of care as provided to patient. ED Prescriptions   None    PDMP not reviewed this encounter.  Pending results:  Labs Reviewed  CBC WITH DIFFERENTIAL/PLATELET  IRON AND TIBC  HEMOGLOBIN A1C      Discharge Instructions      During your visit today, we checked a complete blood cell count, and iron level and your hemoglobin A1c to evaluate for possible causes of your fatigue.  We will contact you with those results once we receive them.  I anticipate they will be back by tomorrow afternoon.  Please consider rescheduling her appointment with your primary care provider so you can follow-up on any abnormal lab findings, if necessary.  Thank you for visiting [facility] today.    Disposition Upon Discharge:  Condition: stable for discharge home  Patient presented with an acute illness with associated systemic symptoms and significant discomfort requiring urgent management. In my opinion, this is a condition that a prudent lay person (someone who possesses an average knowledge of health and medicine) may potentially expect to result in complications if not addressed urgently such as respiratory distress, impairment of bodily function or dysfunction of bodily organs.    Routine symptom specific, illness specific and/or disease specific instructions were discussed with the patient and/or caregiver at length.   As such, the patient has been evaluated and assessed, work-up was performed and treatment was provided in alignment with urgent care protocols and evidence based medicine.  Patient/parent/caregiver has been advised that the patient may require follow up for further testing and treatment if the symptoms continue in spite of treatment, as clinically indicated and appropriate.  Patient/parent/caregiver has been advised to return to the [facility] or PCP if no better; to PCP or the Emergency Department  if new signs and symptoms develop, or if the current signs or symptoms continue to change or worsen for further workup, evaluation and treatment as clinically indicated and appropriate  The patient will follow up with their current PCP if and as advised. If the patient does not currently have a PCP we will assist them in obtaining one.   The patient may need specialty follow up if the symptoms continue, in spite of conservative treatment and management, for further workup, evaluation, consultation and treatment as clinically indicated and appropriate.  Patient/parent/caregiver verbalized understanding and agreement of plan as discussed.  All questions were addressed during visit.  Please see discharge instructions below for further details of plan.  This office note has been dictated using Teaching laboratory technician.  Unfortunately, this method of dictation can sometimes lead to typographical or grammatical errors.  I apologize for your inconvenience in advance if this occurs.  Please do not hesitate to reach out to me if clarification is needed.      Joesph Shaver Scales, PA-C 05/31/23 1331

## 2023-06-01 LAB — CBC WITH DIFFERENTIAL/PLATELET
Basophils Absolute: 0.1 10*3/uL (ref 0.0–0.2)
Basos: 1 %
EOS (ABSOLUTE): 0.3 10*3/uL (ref 0.0–0.4)
Eos: 7 %
Hematocrit: 43.7 % (ref 34.0–46.6)
Hemoglobin: 13.5 g/dL (ref 11.1–15.9)
Immature Grans (Abs): 0 10*3/uL (ref 0.0–0.1)
Immature Granulocytes: 0 %
Lymphocytes Absolute: 1.9 10*3/uL (ref 0.7–3.1)
Lymphs: 48 %
MCH: 22.1 pg — ABNORMAL LOW (ref 26.6–33.0)
MCHC: 30.9 g/dL — ABNORMAL LOW (ref 31.5–35.7)
MCV: 72 fL — ABNORMAL LOW (ref 79–97)
Monocytes Absolute: 0.4 10*3/uL (ref 0.1–0.9)
Monocytes: 10 %
Neutrophils Absolute: 1.3 10*3/uL — ABNORMAL LOW (ref 1.4–7.0)
Neutrophils: 34 %
Platelets: 220 10*3/uL (ref 150–450)
RBC: 6.1 x10E6/uL — ABNORMAL HIGH (ref 3.77–5.28)
RDW: 17.4 % — ABNORMAL HIGH (ref 11.7–15.4)
WBC: 3.9 10*3/uL (ref 3.4–10.8)

## 2023-06-01 LAB — HEMOGLOBIN A1C
Est. average glucose Bld gHb Est-mCnc: 140 mg/dL
Hgb A1c MFr Bld: 6.5 % — ABNORMAL HIGH (ref 4.8–5.6)

## 2023-06-01 LAB — IRON AND TIBC
Iron Saturation: 25 % (ref 15–55)
Iron: 70 ug/dL (ref 27–159)
Total Iron Binding Capacity: 285 ug/dL (ref 250–450)
UIBC: 215 ug/dL (ref 131–425)

## 2023-07-31 ENCOUNTER — Encounter: Payer: No Typology Code available for payment source | Admitting: Obstetrics and Gynecology

## 2023-08-21 ENCOUNTER — Encounter: Payer: Self-pay | Admitting: Obstetrics and Gynecology

## 2023-08-21 ENCOUNTER — Encounter: Admitting: Obstetrics and Gynecology

## 2023-08-21 NOTE — Progress Notes (Unsigned)
   ANNUAL EXAM Patient name: Casey Palmer MRN [id number]  Date of birth: 1971/03/03 Chief Complaint:   No chief complaint on file.  History of Present Illness:   Casey Palmer is a 53 y.o. No obstetric history on file. being seen today for a routine annual exam.  Current complaints: ***  Menstrual concerns? {yes/no:20286}  *** Breast or nipple changes? {yes/no:20286} *** Contraception use? {yes/no:20286} *** Sexually active? {yes/no:20286} ***  Patient's last menstrual period was 06/22/2017.   The pregnancy intention screening data noted above was reviewed. Potential methods of contraception were discussed. The patient elected to proceed with No data recorded.   Last pap No results found for: "DIAGPAP", "HPVHIGH", "ADEQPAP"   H/O abnormal pap: {yes/yes***/no:23866} Last mammogram: ***.  Last colonoscopy: ***.       No data to display               No data to display           Review of Systems:   Pertinent items are noted in HPI Denies any headaches, blurred vision, fatigue, shortness of breath, chest pain, abdominal pain, abnormal vaginal discharge/itching/odor/irritation, problems with periods, bowel movements, urination, or intercourse unless otherwise stated above. Pertinent History Reviewed:  Reviewed past medical,surgical, social and family history.  Reviewed problem list, medications and allergies. Physical Assessment:  There were no vitals filed for this visit.There is no height or weight on file to calculate BMI.        Physical Examination:   General appearance - well appearing, and in no distress  Mental status - alert, oriented to person, place, and time  Psych:  She has a normal mood and affect  Skin - warm and dry, normal color, no suspicious lesions noted  Chest - effort normal, all lung fields clear to auscultation bilaterally  Heart - normal rate and regular rhythm  Breasts - breasts appear normal, no suspicious masses, no skin or nipple  changes or  axillary nodes  Abdomen - soft, nontender, nondistended, no masses or organomegaly  Pelvic -  VULVA: normal appearing vulva with no masses, tenderness or lesions   VAGINA: normal appearing vagina with normal color and discharge, no lesions   CERVIX: normal appearing cervix without discharge or lesions, no CMT  Thin prep pap is {Desc; done/not:10129} *** HR HPV cotesting  UTERUS: uterus is felt to be normal size, shape, consistency and nontender   ADNEXA: No adnexal masses or tenderness noted.  Extremities:  No swelling or varicosities noted  Chaperone present for exam  No results found for this or any previous visit (from the past 24 hours).    Assessment & Plan:  There are no diagnoses linked to this encounter.      No orders of the defined types were placed in this encounter.   Meds: No orders of the defined types were placed in this encounter.   Follow-up: No follow-ups on file.  Kiki Pelton, MD 08/21/2023 11:50 AM

## 2023-10-24 ENCOUNTER — Other Ambulatory Visit: Payer: Self-pay | Admitting: Obstetrics and Gynecology

## 2023-10-24 DIAGNOSIS — Z1231 Encounter for screening mammogram for malignant neoplasm of breast: Secondary | ICD-10-CM

## 2024-02-05 ENCOUNTER — Other Ambulatory Visit: Payer: Self-pay | Admitting: Internal Medicine

## 2024-02-05 DIAGNOSIS — R3121 Asymptomatic microscopic hematuria: Secondary | ICD-10-CM

## 2024-02-08 ENCOUNTER — Ambulatory Visit

## 2024-03-28 ENCOUNTER — Encounter

## 2024-05-06 ENCOUNTER — Encounter

## 2024-06-20 ENCOUNTER — Encounter
